# Patient Record
Sex: Female | Born: 1955 | Race: White | Hispanic: No | Marital: Married | Smoking: Never smoker
Health system: Southern US, Community
[De-identification: ages and names within clinical notes are randomized; demographics above are authoritative.]

## PROBLEM LIST (undated history)

## (undated) DIAGNOSIS — J45909 Unspecified asthma, uncomplicated: Secondary | ICD-10-CM

## (undated) DIAGNOSIS — L309 Dermatitis, unspecified: Secondary | ICD-10-CM

## (undated) DIAGNOSIS — J069 Acute upper respiratory infection, unspecified: Secondary | ICD-10-CM

## (undated) HISTORY — DX: Dermatitis, unspecified: L30.9

## (undated) HISTORY — PX: BREAST EXCISIONAL BIOPSY: SUR124

## (undated) HISTORY — PX: SINOSCOPY: SHX187

## (undated) HISTORY — DX: Acute upper respiratory infection, unspecified: J06.9

## (undated) HISTORY — DX: Unspecified asthma, uncomplicated: J45.909

---

## 2007-08-28 ENCOUNTER — Emergency Department (HOSPITAL_COMMUNITY): Admission: EM | Admit: 2007-08-28 | Discharge: 2007-08-28 | Payer: Self-pay | Admitting: Emergency Medicine

## 2007-09-08 ENCOUNTER — Encounter: Admission: RE | Admit: 2007-09-08 | Discharge: 2007-09-08 | Payer: Self-pay | Admitting: Family Medicine

## 2007-10-15 ENCOUNTER — Ambulatory Visit: Payer: Self-pay | Admitting: Gastroenterology

## 2007-10-15 LAB — CONVERTED CEMR LAB
BUN: 11 mg/dL (ref 6–23)
Basophils Relative: 1.3 % — ABNORMAL HIGH (ref 0.0–1.0)
Bilirubin, Direct: 0.1 mg/dL (ref 0.0–0.3)
CO2: 28 meq/L (ref 19–32)
Calcium: 9.4 mg/dL (ref 8.4–10.5)
GFR calc Af Amer: 137 mL/min
Hemoglobin: 14.8 g/dL (ref 12.0–15.0)
MCHC: 34.8 g/dL (ref 30.0–36.0)
Monocytes Absolute: 0.5 10*3/uL (ref 0.2–0.7)
Monocytes Relative: 8.5 % (ref 3.0–11.0)
Neutro Abs: 2.9 10*3/uL (ref 1.4–7.7)
Potassium: 4.6 meq/L (ref 3.5–5.1)
RBC: 4.74 M/uL (ref 3.87–5.11)
Sodium: 141 meq/L (ref 135–145)
Total Bilirubin: 0.6 mg/dL (ref 0.3–1.2)

## 2007-10-16 ENCOUNTER — Encounter: Payer: Self-pay | Admitting: Gastroenterology

## 2007-10-20 ENCOUNTER — Ambulatory Visit: Payer: Self-pay | Admitting: Gastroenterology

## 2007-10-20 ENCOUNTER — Encounter: Payer: Self-pay | Admitting: Gastroenterology

## 2007-10-20 DIAGNOSIS — K219 Gastro-esophageal reflux disease without esophagitis: Secondary | ICD-10-CM

## 2007-10-20 LAB — CONVERTED CEMR LAB
Alkaline Phosphatase: 65 units/L (ref 39–117)
Bilirubin, Direct: 0.1 mg/dL (ref 0.0–0.3)
Iron: 89 ug/dL (ref 42–145)
Total Bilirubin: 0.9 mg/dL (ref 0.3–1.2)
Total Protein: 7.6 g/dL (ref 6.0–8.3)

## 2008-01-20 DIAGNOSIS — F329 Major depressive disorder, single episode, unspecified: Secondary | ICD-10-CM

## 2008-01-20 DIAGNOSIS — I10 Essential (primary) hypertension: Secondary | ICD-10-CM | POA: Insufficient documentation

## 2008-01-20 DIAGNOSIS — R197 Diarrhea, unspecified: Secondary | ICD-10-CM

## 2008-01-20 DIAGNOSIS — J45909 Unspecified asthma, uncomplicated: Secondary | ICD-10-CM | POA: Insufficient documentation

## 2008-01-20 DIAGNOSIS — K449 Diaphragmatic hernia without obstruction or gangrene: Secondary | ICD-10-CM | POA: Insufficient documentation

## 2008-01-20 DIAGNOSIS — E7800 Pure hypercholesterolemia, unspecified: Secondary | ICD-10-CM

## 2008-10-06 ENCOUNTER — Other Ambulatory Visit: Admission: RE | Admit: 2008-10-06 | Discharge: 2008-10-06 | Payer: Self-pay | Admitting: Family Medicine

## 2008-11-18 ENCOUNTER — Encounter: Admission: RE | Admit: 2008-11-18 | Discharge: 2008-11-18 | Payer: Self-pay | Admitting: Family Medicine

## 2011-04-17 NOTE — Assessment & Plan Note (Signed)
[facility]                         GASTROENTEROLOGY OFFICE NOTE   NAME:[name], [name] DEVLIN                      MRN:          [id number]  DATE:10/15/2007                            DOB:          Dec 15, 1955    REASON FOR REFERRAL:  Dr. Smith Mince asked me to evaluate [name] [name] in  consultation regarding chronic cough, burning in throat, diarrhea,  recent abdominal pains.   HISTORY OF PRESENT ILLNESS:  [name] [name] is a very pleasant 55 year old  woman who has a variety of upper and lower GI symptoms.  First, she  presented to the emergency room with mid epigastric abdominal pains that  were quite severe.  This was about 4-5 weeks ago.  She does not have  associated diarrhea or vomiting.  She had a lab test and imaging studies  done in the emergency room.  Lab tests showed a white count of 14.6,  transaminases were elevated with an AST of 112 and ALT of 235, normal  bilirubin.  She had a CT scan performed showing some very small  hypodensities in her liver that were thought nonspecific, they described  GERD, they also described some borderline wall thickening in her  ascending colon very mild colitis.  She was told that she had an  infection in her colon, she was sent home with 2 antibiotic.  She  believe it was ciprofloxacin and Flagyl.  Since then, the abdominal pain  has subsided.  She completed her antibiotic course and she has noticed a  dramatic increase in her daily stooling.  For years, she would go 5  times a day, intermittently seeing blood but not any dramatic blood.  Since the antibiotics she has been going 10-15 times a day, it has been  looser than usual.  She has no nocturnal diarrhea.   She also describes chronic belching, chronic burning in her throat.  She  does have intermittent mild solid food dysphagia.  She has never been on  antacid medicines, as far as she knows.   REVIEW OF SYSTEMS:  Notable for 20 pounds weight gain in the past  6  months, is otherwise essentially normal and is available on her nursing  intake sheet.   PAST MEDICAL HISTORY:  1. Hypertension.  2. Asthma.  3. Elevated cholesterol.  4. Depression.   CURRENT MEDICINES:  Zyrtec.   ALLERGIES:  NO KNOWN DRUG ALLERGIES.   SOCIAL HISTORY:  Single, lives with her son and his father.  Works as a  Runner, broadcasting/film/video.  A nonsmoker, drinks alcohol daily.  Chews gum all the time,  eats a lot of peppermints on a daily basis, rare caffeine.  Eats her  dinner generally at 8 o'clock, is done around 9 and lays down for bed at  about 10.  She says she lays down for naps frequently following meals.   FAMILY HISTORY:  No colon cancer, colon polyps in family.   PHYSICAL EXAMINATION:  Five foot 4 inches, 171 pounds, blood pressure  138/66, pulse 94.  CONSTITUTIONAL:  Generally well-appearing.  Coughing, dry cough  frequently throughout her examination.  NEUROLOGIC:  Alert and  oriented x3.  EYES:  Extraocular movements intact.  MOUTH:  Oropharynx moist, no lesions.  NECK:  Supple, no lymphadenopathy.  CARDIOVASCULAR:  Heart regular rate and rhythm.  LUNGS:  Clear to auscultation bilaterally.  ABDOMEN:  Soft, nontender, nondistended, normal bowel sounds.  EXTREMITIES:  No lower extremity edema.  SKIN:  No rashes or lesions on visible extremities.   ASSESSMENT/PLAN:  A 55 year old woman with diarrhea (worse since recent  antibiotics for colitis), acid taste in her throat, chronic belching,  intermittent dysphagia.   Suspect [name] [name] has underlying gastroesophageal reflux disease.  She  eats meals very soon before laying down for bed or naps and that is  probably contributing.  She also eats a lot of peppermint and gum and  that could be contributing as well.  I am giving her a gastroesophageal  reflux disease handout, I am recommending strict avoidance of all mint  products.  I have instructed her not to lay down for bed within 3 hours  of finishing a meal.  I  think we should proceed with a full upper  endoscopy at her soonest convenience.  She did have abnormal liver tests  in the emergency room.  This was not really mentioned in the emergency  room visits.  I will repeat lab testing including a CBC and a complete  metabolic profile as well as stool testing for her worsening of her  chronic loose stools.  She does have intermittent bleeding rectally and  with going 15 times a day now I think we should also proceed with a  colonoscopy at her soonest convenience as well.     Rachael Fee, MD  Electronically Signed    DPJ/MedQ  DD: 10/15/2007  DT: 10/15/2007  Job #: [id number]   cc:   Talmadge Coventry, M.D.

## 2011-09-13 LAB — LIPASE, BLOOD: Lipase: 30

## 2011-09-13 LAB — COMPREHENSIVE METABOLIC PANEL
ALT: 235 — ABNORMAL HIGH
CO2: 25
Chloride: 98
Potassium: 3.6
Sodium: 135
Total Bilirubin: 0.8

## 2011-09-13 LAB — DIFFERENTIAL
Basophils Relative: 0
Eosinophils Relative: 0
Lymphs Abs: 1.1
Monocytes Absolute: 0.3

## 2011-09-13 LAB — CBC
HCT: 50.4 — ABNORMAL HIGH
Hemoglobin: 17.1 — ABNORMAL HIGH
MCV: 86.9
Platelets: 345
RDW: 14.4 — ABNORMAL HIGH
WBC: 14.6 — ABNORMAL HIGH

## 2011-09-13 LAB — URINALYSIS, ROUTINE W REFLEX MICROSCOPIC
Bilirubin Urine: NEGATIVE
Nitrite: NEGATIVE
Protein, ur: >300 — AB
Specific Gravity, Urine: 1.025

## 2011-09-13 LAB — URINE MICROSCOPIC-ADD ON

## 2013-02-25 ENCOUNTER — Other Ambulatory Visit: Payer: Self-pay | Admitting: Family Medicine

## 2013-02-25 ENCOUNTER — Other Ambulatory Visit (HOSPITAL_COMMUNITY)
Admission: RE | Admit: 2013-02-25 | Discharge: 2013-02-25 | Disposition: A | Discharge status: Home / Self Care | Payer: BC Managed Care – PPO | Source: Ambulatory Visit | Attending: Family Medicine | Admitting: Family Medicine

## 2013-02-25 DIAGNOSIS — Z01419 Encounter for gynecological examination (general) (routine) without abnormal findings: Secondary | ICD-10-CM | POA: Insufficient documentation

## 2013-02-25 DIAGNOSIS — Z1151 Encounter for screening for human papillomavirus (HPV): Secondary | ICD-10-CM | POA: Insufficient documentation

## 2017-05-27 ENCOUNTER — Other Ambulatory Visit: Payer: Self-pay | Admitting: Family Medicine

## 2017-05-27 DIAGNOSIS — Z1231 Encounter for screening mammogram for malignant neoplasm of breast: Secondary | ICD-10-CM

## 2017-06-04 ENCOUNTER — Ambulatory Visit
Admission: RE | Admit: 2017-06-04 | Discharge: 2017-06-04 | Disposition: A | Discharge status: Home / Self Care | Payer: BC Managed Care – PPO | Source: Ambulatory Visit | Attending: Family Medicine | Admitting: Family Medicine

## 2017-06-04 DIAGNOSIS — Z1231 Encounter for screening mammogram for malignant neoplasm of breast: Secondary | ICD-10-CM

## 2017-07-23 ENCOUNTER — Other Ambulatory Visit (HOSPITAL_BASED_OUTPATIENT_CLINIC_OR_DEPARTMENT_OTHER): Payer: Self-pay

## 2017-07-23 DIAGNOSIS — G478 Other sleep disorders: Secondary | ICD-10-CM

## 2017-07-23 DIAGNOSIS — G4710 Hypersomnia, unspecified: Secondary | ICD-10-CM

## 2017-07-23 DIAGNOSIS — R441 Visual hallucinations: Secondary | ICD-10-CM

## 2017-07-23 DIAGNOSIS — R0683 Snoring: Secondary | ICD-10-CM

## 2017-07-23 DIAGNOSIS — G4759 Other parasomnia: Secondary | ICD-10-CM

## 2017-07-24 ENCOUNTER — Ambulatory Visit (HOSPITAL_BASED_OUTPATIENT_CLINIC_OR_DEPARTMENT_OTHER): Payer: BC Managed Care – PPO | Attending: Family Medicine | Admitting: Internal Medicine

## 2017-07-24 VITALS — Ht 64.0 in | Wt 162.0 lb

## 2017-07-24 DIAGNOSIS — R442 Other hallucinations: Secondary | ICD-10-CM | POA: Diagnosis not present

## 2017-07-24 DIAGNOSIS — R0683 Snoring: Secondary | ICD-10-CM | POA: Diagnosis present

## 2017-07-24 DIAGNOSIS — G4710 Hypersomnia, unspecified: Secondary | ICD-10-CM

## 2017-07-24 DIAGNOSIS — K219 Gastro-esophageal reflux disease without esophagitis: Secondary | ICD-10-CM | POA: Diagnosis not present

## 2017-07-24 DIAGNOSIS — G4753 Recurrent isolated sleep paralysis: Secondary | ICD-10-CM | POA: Diagnosis not present

## 2017-07-24 DIAGNOSIS — G478 Other sleep disorders: Secondary | ICD-10-CM

## 2017-07-24 DIAGNOSIS — G4736 Sleep related hypoventilation in conditions classified elsewhere: Secondary | ICD-10-CM | POA: Diagnosis not present

## 2017-07-24 DIAGNOSIS — G4733 Obstructive sleep apnea (adult) (pediatric): Secondary | ICD-10-CM | POA: Insufficient documentation

## 2017-07-24 DIAGNOSIS — R441 Visual hallucinations: Secondary | ICD-10-CM

## 2017-07-24 DIAGNOSIS — G4759 Other parasomnia: Secondary | ICD-10-CM

## 2017-07-24 DIAGNOSIS — I10 Essential (primary) hypertension: Secondary | ICD-10-CM | POA: Diagnosis not present

## 2017-07-24 DIAGNOSIS — G4719 Other hypersomnia: Secondary | ICD-10-CM | POA: Insufficient documentation

## 2017-07-28 NOTE — Procedures (Signed)
   NAME: [name] [name] DATE OF BIRTH:  [birth year] MEDICAL RECORD NUMBER [id number]  LOCATION: [facility]  PHYSICIAN: Benjaman Kindler, MD  DATE OF STUDY: 07/24/2017  SLEEP STUDY TYPE: Nocturnal Polysomnogram               REFERRING PROVIDER: Carilyn Goodpasture, PA-C  INDICATION FOR STUDY: nonrestorative sleep, snoring, excessive daytime sleepiness, sleep paralysis, sleep hallucinations; symptoms x years. Comorbidities: HTN. GERD   EPWORTH SLEEPINESS SCORE: 14 HEIGHT: 5\' 4"  (162.6 cm)  WEIGHT: 162 lb (73.5 kg)    Body mass index is 27.81 kg/m.  NECK SIZE: 14 in.  MEDICATIONS Patient self administered medications include: N/A. Medications administered during study include No sleep medicine administered.Marland Kitchen  SLEEP STUDY TECHNIQUE A multi-channel overnight Polysomnography study was performed. The channels recorded and monitored were central and occipital EEG, electrooculogram (EOG), submentalis EMG (chin), nasal and oral airflow, thoracic and abdominal wall motion, anterior tibialis EMG, snore microphone, electrocardiogram, and a pulse oximetry.  TECHNICAL COMMENTS Comments added by Technician: PATIENT WAS ORDERED AS A NPSG ONLY.  Comments added by Scorer: N/A  SLEEP ARCHITECTURE The study was initiated at 10:15:13 PM and terminated at 4:43:26 AM. The total recorded time was 388.2 minutes. EEG confirmed total sleep time was 339.8 minutes yielding a sleep efficiency of 87.5%. Sleep onset after lights out was 30.9 minutes with a REM latency of 254.5 minutes. The patient spent 15.89% of the night in stage N1 sleep, 72.19% in stage N2 sleep, 0.00% in stage N3 and 11.92% in REM. Wake after sleep onset (WASO) was 17.5 minutes. The Arousal Index was 30.7/hour.  RESPIRATORY PARAMETERS There were a total of 116 respiratory disturbances out of which 86 were apneas ( 82 obstructive, 4 mixed, 0 central) and 30 hypopneas. The apnea/hypopnea index (AHI) was 20.5 events/hour. The  central sleep apnea index was 0.0 events/hour. The REM AHI was 13.3 events/hour and NREM AHI was 21.4 events/hour. The supine AHI was 24.7 events/hour and the non supine AHI was 16.25 supine during 50.02% of sleep. Overall RDI was 35 events/hour and REM RDI was 25 events/hour. Respiratory disturbances were associated with oxygen desaturation down to a nadir of 89.00% during sleep. The mean oxygen saturation during the study was 93.89%. The cumulative time under 88% oxygen saturation was 0.0 minutes.  LEG MOVEMENT DATA The total leg movements were with a resulting leg movement index of 1.2/hr . Associated arousal with leg movement index was 0.2/hr.  CARDIAC DATA The underlying cardiac rhythm was most consistent with sinus rhythm. Mean heart rate during sleep was 74.02 bpm. Additional rhythm abnormalities include None.  IMPRESSIONS Moderate obstructive sleep apnea Minimal hypoxemia associated with sleep disordered breathing Sleep disordered breathing is worse in supine sleep  DIAGNOSIS - Obstructive Sleep Apnea (327.23 [G47.33 ICD-10])  RECOMMENDATIONS The patient should be treated with CPAP or an oral appliance. If CPAP is chosen, this could be initiated with auto-adjusting CPAP (APAP) or with in-lab CPAP titration  Benjaman Kindler, MD Sleep Specialist Certification, American Board of Internal Medicine  ELECTRONICALLY SIGNED ON:  07/28/2017, 9:14 PM [facility] PH: [phone]   FX: [phone] ACCREDITED BY THE AMERICAN ACADEMY OF SLEEP MEDICINE

## 2017-10-21 ENCOUNTER — Encounter: Payer: Self-pay | Admitting: Gastroenterology

## 2018-10-15 ENCOUNTER — Other Ambulatory Visit (HOSPITAL_COMMUNITY)
Admission: RE | Admit: 2018-10-15 | Discharge: 2018-10-15 | Disposition: A | Discharge status: Home / Self Care | Payer: BC Managed Care – PPO | Source: Ambulatory Visit | Attending: Family Medicine | Admitting: Family Medicine

## 2018-10-15 ENCOUNTER — Other Ambulatory Visit: Payer: Self-pay | Admitting: Family Medicine

## 2018-10-15 DIAGNOSIS — Z124 Encounter for screening for malignant neoplasm of cervix: Secondary | ICD-10-CM | POA: Diagnosis present

## 2018-10-16 LAB — CYTOLOGY - PAP
Diagnosis: NEGATIVE
HPV: NOT DETECTED

## 2019-01-30 ENCOUNTER — Ambulatory Visit: Payer: BC Managed Care – PPO | Admitting: Psychiatry

## 2019-01-30 ENCOUNTER — Ambulatory Visit (INDEPENDENT_AMBULATORY_CARE_PROVIDER_SITE_OTHER): Payer: BC Managed Care – PPO | Admitting: Psychiatry

## 2019-01-30 DIAGNOSIS — F331 Major depressive disorder, recurrent, moderate: Secondary | ICD-10-CM

## 2019-01-30 MED ORDER — LITHIUM CARBONATE 300 MG PO TABS: ORAL_TABLET | ORAL | 0 refills | Status: AC
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
## 2019-02-23 ENCOUNTER — Ambulatory Visit: Payer: BC Managed Care – PPO | Admitting: Psychiatry

## 2020-01-30 ENCOUNTER — Ambulatory Visit: Payer: BC Managed Care – PPO | Attending: Internal Medicine

## 2020-01-30 DIAGNOSIS — Z23 Encounter for immunization: Secondary | ICD-10-CM

## 2020-01-30 NOTE — Progress Notes (Signed)
   Covid-19 Vaccination Clinic  Name:  Lincoln Kleiner    MRN: [id number] DOB: January 06, 1956  01/30/2020  Ms. Seki was observed post Covid-19 immunization for 15 minutes without incidence. She was provided with Vaccine Information Sheet and instruction to access the V-Safe system.   Ms. Jakel was instructed to call 911 with any severe reactions post vaccine: Marland Kitchen Difficulty breathing  . Swelling of your face and throat  . A fast heartbeat  . A bad rash all over your body  . Dizziness and weakness    Immunizations Administered    Name Date Dose VIS Date Route   Pfizer COVID-19 Vaccine 01/30/2020 12:27 PM 0.3 mL 11/13/2019 Intramuscular   Manufacturer: [employer], [employer]   Lot: [id number]   NDC: [address]-1

## 2020-02-24 ENCOUNTER — Ambulatory Visit: Payer: BC Managed Care – PPO | Attending: Internal Medicine

## 2020-02-24 DIAGNOSIS — Z23 Encounter for immunization: Secondary | ICD-10-CM

## 2020-02-24 NOTE — Progress Notes (Signed)
   Covid-19 Vaccination Clinic  Name:  [name] [name]    MRN: [id number] DOB: 01/29/1956  02/24/2020  [name] was observed post Covid-19 immunization for 15 minutes without incident. She was provided with Vaccine Information Sheet and instruction to access the V-Safe system.   [name] was instructed to call 911 with any severe reactions post vaccine: Marland Kitchen Difficulty breathing  . Swelling of face and throat  . A fast heartbeat  . A bad rash all over body  . Dizziness and weakness   Immunizations Administered    Name Date Dose VIS Date Route   Pfizer COVID-19 Vaccine 02/24/2020 12:32 PM 0.3 mL 11/13/2019 Intramuscular   Manufacturer: [employer], [employer]   Lot: [id number]   NDC: [address]-2

## 2020-06-09 ENCOUNTER — Other Ambulatory Visit: Payer: Self-pay | Admitting: Family Medicine

## 2020-06-09 DIAGNOSIS — M8580 Other specified disorders of bone density and structure, unspecified site: Secondary | ICD-10-CM

## 2020-09-16 ENCOUNTER — Other Ambulatory Visit: Payer: Self-pay | Admitting: Family Medicine

## 2020-09-16 DIAGNOSIS — Z1231 Encounter for screening mammogram for malignant neoplasm of breast: Secondary | ICD-10-CM

## 2020-09-28 ENCOUNTER — Ambulatory Visit
Admission: RE | Admit: 2020-09-28 | Discharge: 2020-09-28 | Disposition: A | Discharge status: Home / Self Care | Payer: BC Managed Care – PPO | Source: Ambulatory Visit | Attending: Family Medicine | Admitting: Family Medicine

## 2020-09-28 ENCOUNTER — Other Ambulatory Visit: Payer: Self-pay

## 2020-09-28 DIAGNOSIS — Z1231 Encounter for screening mammogram for malignant neoplasm of breast: Secondary | ICD-10-CM

## 2020-12-12 ENCOUNTER — Other Ambulatory Visit: Payer: Self-pay

## 2020-12-12 ENCOUNTER — Ambulatory Visit (INDEPENDENT_AMBULATORY_CARE_PROVIDER_SITE_OTHER): Payer: BC Managed Care – PPO | Admitting: Allergy

## 2020-12-12 ENCOUNTER — Encounter: Payer: Self-pay | Admitting: Allergy

## 2020-12-12 VITALS — BP 118/72 | HR 70 | Temp 97.0°F | Resp 18 | Ht 63.0 in | Wt 168.0 lb

## 2020-12-12 DIAGNOSIS — J3089 Other allergic rhinitis: Secondary | ICD-10-CM

## 2020-12-12 DIAGNOSIS — R12 Heartburn: Secondary | ICD-10-CM | POA: Diagnosis not present

## 2020-12-12 DIAGNOSIS — L301 Dyshidrosis [pompholyx]: Secondary | ICD-10-CM

## 2020-12-12 DIAGNOSIS — J45909 Unspecified asthma, uncomplicated: Secondary | ICD-10-CM

## 2020-12-12 MED ORDER — AZELASTINE HCL 0.15 % NA SOLN: 1.0000 | Freq: Two times a day (BID) | NASAL | 5 refills | Status: DC | PRN

## 2020-12-12 MED ORDER — BUDESONIDE-FORMOTEROL FUMARATE 80-4.5 MCG/ACT IN AERO: 2.0000 | INHALATION_SPRAY | Freq: Two times a day (BID) | RESPIRATORY_TRACT | 5 refills | Status: DC

## 2020-12-12 NOTE — Assessment & Plan Note (Signed)
Diagnosed with asthma in her 30s.  Currently on Qvar unknown dose 1 puff twice a day for 10+ years, Singulair and albuterol as needed.  Patient has daily symptoms however does not like to use albuterol during these episodes.  History of reflux and taking Nexium daily.  Today's spirometry was normal with no improvement in FEV1 post bronchodilator treatment.  Clinically feeling slightly improved.  ACT score 13.  . Daily controller medication(s): start Symbicort 2 puffs twice a day with spacer and rinse mouth afterwards. o Spacer given and demonstrated proper use with inhaler. Patient understood technique and all questions/concerned were addressed.  o This replaces Qvar for now.  . May use albuterol rescue inhaler 2 puffs every 4 to 6 hours as needed for shortness of breath, chest tightness, coughing, and wheezing. May use albuterol rescue inhaler 2 puffs 5 to 15 minutes prior to strenuous physical activities. Monitor frequency of use.  . Repeat spirometry at next visit.  . If no improvement, will get CXR as well.

## 2020-12-12 NOTE — Patient Instructions (Addendum)
Today's skin testing showed:  Positive to grass, weed, ragweed, trees, dust mites, cat, dogs, mold, feathers, cockroaches.  Borderline to horse.   Asthma: . Daily controller medication(s): start Symbicort 2 puffs twice a day with spacer and rinse mouth afterwards. o Spacer given and demonstrated proper use with inhaler. Patient understood technique and all questions/concerned were addressed.  o This replaces Qvar for now.  . May use albuterol rescue inhaler 2 puffs every 4 to 6 hours as needed for shortness of breath, chest tightness, coughing, and wheezing. May use albuterol rescue inhaler 2 puffs 5 to 15 minutes prior to strenuous physical activities. Monitor frequency of use.  . Asthma control goals:  o Full participation in all desired activities (may need albuterol before activity) o Albuterol use two times or less a week on average (not counting use with activity) o Cough interfering with sleep two times or less a month o Oral steroids no more than once a year o No hospitalizations  Environmental allergies:   Start environmental control measures.  Continue Singulair (montelukast) 10mg  daily at night.  May use over the counter antihistamines such as Zyrtec (cetirizine), Claritin (loratadine), Allegra (fexofenadine), or Xyzal (levocetirizine) daily as needed.  May use Flonase (fluticasone) nasal spray 1 spray per nostril twice a day as needed for nasal congestion.   May use azelastine nasal spray 1-2 sprays per nostril twice a day as needed for runny nose/drainage.  Nasal saline spray (i.e., Simply Saline) or nasal saline lavage (i.e., NeilMed) is recommended as needed and prior to medicated nasal sprays.  Eczema:  May use Eucrisa (crisaborole) 2% ointment twice a day on mild eczema flares on the hands. This is a non-steroid ointment.   If it works well, let me know and I will send in a prescription.  If it burns, place the medication in the refrigerator.   Apply a  thin layer of moisturizer and then apply the Eucrisa on top of it.  Follow up in 2 months or sooner if needed for asthma check.   Skin care recommendations  Bath time: . Always use lukewarm water. AVOID very hot or cold water. Keep bathing time to 5-10 minutes. . Do NOT use bubble bath. . Use a mild soap and use just enough to wash the dirty areas. . Do NOT scrub skin vigorously.  . After bathing, pat dry your skin with a towel. Do NOT rub or scrub the skin.  Moisturizers and prescriptions:  . ALWAYS apply moisturizers immediately after bathing (within 3 minutes). This helps to lock-in moisture. . Use the moisturizer several times a day over the whole body. Marland Kitchen summer moisturizers include: Aveeno, CeraVe, Cetaphil. Peri Jefferson winter moisturizers include: Aquaphor, Vaseline, Cerave, Cetaphil, Eucerin, Vanicream. . When using moisturizers along with medications, the moisturizer should be applied about one hour after applying the medication to prevent diluting effect of the medication or moisturize around where you applied the medications. When not using medications, the moisturizer can be continued twice daily as maintenance.  Laundry and clothing: . Avoid laundry products with added color or perfumes. . Use unscented hypo-allergenic laundry products such as Tide free, Cheer free & gentle, and All free and clear.  . If the skin still seems dry or sensitive, you can try double-rinsing the clothes. . Avoid tight or scratchy clothing such as wool. . Do not use fabric softeners or dyer sheets.  Reducing Pollen Exposure . Pollen seasons: trees (spring), grass (summer) and ragweed/weeds (fall). 04-11-1979 Keep windows  closed in your home and car to lower pollen exposure.  Lilian Kapur air conditioning in the bedroom and throughout the house if possible.  . Avoid going out in dry windy days - especially early morning. . Pollen counts are highest between 5 - 10 AM and on dry, hot and windy days.  . Save  outside activities for late afternoon or after a heavy rain, when pollen levels are lower.  . Avoid mowing of grass if you have grass pollen allergy. Marland Kitchen Be aware that pollen can also be transported indoors on people and pets.  . Dry your clothes in an automatic dryer rather than hanging them outside where they might collect pollen.  . Rinse hair and eyes before bedtime. Control of House Dust Mite Allergen . Dust mite allergens are a common trigger of allergy and asthma symptoms. While they can be found throughout the house, these microscopic creatures thrive in warm, humid environments such as bedding, upholstered furniture and carpeting. . Because so much time is spent in the bedroom, it is essential to reduce mite levels there.  . Encase pillows, mattresses, and box springs in special allergen-proof fabric covers or airtight, zippered plastic covers.  . Bedding should be washed weekly in hot water (130 F) and dried in a hot dryer. Allergen-proof covers are available for comforters and pillows that can't be regularly washed.  Reyes Ivan the allergy-proof covers every few months. Minimize clutter in the bedroom. Keep pets out of the bedroom.  Marland Kitchen Keep humidity less than 50% by using a dehumidifier or air conditioning. You can buy a humidity measuring device called a hygrometer to monitor this.  . If possible, replace carpets with hardwood, linoleum, or washable area rugs. If that's not possible, vacuum frequently with a vacuum that has a HEPA filter. . Remove all upholstered furniture and non-washable window drapes from the bedroom. . Remove all non-washable stuffed toys from the bedroom.  Wash stuffed toys weekly. Pet Allergen Avoidance: . Contrary to popular opinion, there are no "hypoallergenic" breeds of dogs or cats. That is because people are not allergic to an animal's hair, but to an allergen found in the animal's saliva, dander (dead skin flakes) or urine. Pet allergy symptoms typically occur  within minutes. For some people, symptoms can build up and become most severe 8 to 12 hours after contact with the animal. People with severe allergies can experience reactions in public places if dander has been transported on the pet owners' clothing. Marland Kitchen Keeping an animal outdoors is only a partial solution, since homes with pets in the yard still have higher concentrations of animal allergens. . Before getting a pet, ask your allergist to determine if you are allergic to animals. If your pet is already considered part of your family, try to minimize contact and keep the pet out of the bedroom and other rooms where you spend a great deal of time. . As with dust mites, vacuum carpets often or replace carpet with a hardwood floor, tile or linoleum. . High-efficiency particulate air (HEPA) cleaners can reduce allergen levels over time. . While dander and saliva are the source of cat and dog allergens, urine is the source of allergens from rabbits, hamsters, mice and [place] pigs; so ask a non-allergic family member to clean the animal's cage. . If you have a pet allergy, talk to your allergist about the potential for allergy immunotherapy (allergy shots). This strategy can often provide long-term relief. Mold Control . Mold and fungi can grow  on a variety of surfaces provided certain temperature and moisture conditions exist.  . Outdoor molds grow on plants, decaying vegetation and soil. The major outdoor mold, Alternaria and Cladosporium, are found in very high numbers during hot and dry conditions. Generally, a late summer - fall peak is seen for common outdoor fungal spores. Rain will temporarily lower outdoor mold spore count, but counts rise rapidly when the rainy period ends. . The most important indoor molds are Aspergillus and Penicillium. Dark, humid and poorly ventilated basements are ideal sites for mold growth. The next most common sites of mold growth are the bathroom and the kitchen. Outdoor  (Seasonal) Mold Control . Use air conditioning and keep windows closed. . Avoid exposure to decaying vegetation. Marland Kitchen Avoid leaf raking. . Avoid grain handling. . Consider wearing a face mask if working in moldy areas.  Indoor (Perennial) Mold Control  . Maintain humidity below 50%. . Get rid of mold growth on hard surfaces with water, detergent and, if necessary, 5% bleach (do not mix with other cleaners). Then dry the area completely. If mold covers an area more than 10 square feet, consider hiring an indoor environmental professional. . For clothing, washing with soap and water is best. If moldy items cannot be cleaned and dried, throw them away. . Remove sources e.g. contaminated carpets. . Repair and seal leaking roofs or pipes. Using dehumidifiers in damp basements may be helpful, but empty the water and clean units regularly to prevent mildew from forming. All rooms, especially basements, bathrooms and kitchens, require ventilation and cleaning to deter mold and mildew growth. Avoid carpeting on concrete or damp floors, and storing items in damp areas. Cockroach Allergen Avoidance Cockroaches are often found in the homes of densely populated urban areas, schools or commercial buildings, but these creatures can lurk almost anywhere. This does not mean that you have a dirty house or living area. . Block all areas where roaches can enter the home. This includes crevices, wall cracks and windows.  . Cockroaches need water to survive, so fix and seal all leaky faucets and pipes. Have an exterminator go through the house when your family and pets are gone to eliminate any remaining roaches. Marland Kitchen Keep food in lidded containers and put pet food dishes away after your pets are done eating. Vacuum and sweep the floor after meals, and take out garbage and recyclables. Use lidded garbage containers in the kitchen. Wash dishes immediately after use and clean under stoves, refrigerators or toasters where crumbs  can accumulate. Wipe off the stove and other kitchen surfaces and cupboards regularly.

## 2020-12-12 NOTE — Assessment & Plan Note (Addendum)
On the hands and using clobetasol as needed.  Gave handout on proper skin care.   May use Eucrisa (crisaborole) 2% ointment twice a day on mild eczema flares on the hands. This is a non-steroid ointment. Samples given.   If it works well, let me know and I will send in a prescription.  If it burns, place the medication in the refrigerator.   Apply a thin layer of moisturizer and then apply the Eucrisa on top of it.

## 2020-12-12 NOTE — Progress Notes (Signed)
New Patient Note  RE: [name] [name] MRN: [id number] DOB: 1956-01-23 Date of Office Visit: 12/12/2020  Referring provider: No ref. provider found Primary care provider: Sigmund Hazel, MD  Chief Complaint: Allergic Reaction (No reactions to foods ) and Allergy Testing (Dust, dust mites. Trees tested positive to in the past )  History of Present Illness: I had the pleasure of seeing [name] [name] for initial evaluation at the [facility] on 12/12/2020. She is a 65 y.o. female, who is self-referred here for the evaluation of asthma and allergies.  Asthma: She reports symptoms of chest tightness, shortness of breath, coughing with post tussive emesis at times, wheezing, nocturnal awakenings for 30+ years. Current medications include Qvar unknown dose 1 puff BID x 10+ years, Singulair and albuterol prn which help. She reports not using aerochamber with inhalers. She tried the following inhalers: Flovent, Advair, Pulmicort. Main triggers are weather changes. In the last month, frequency of symptoms: daily. Frequency of SABA use: <1x/week. In the last 12 months, emergency room visits/urgent care visits/doctor office visits or hospitalizations due to respiratory issues: 1. In the last 12 months, oral steroids courses: one. Lifetime history of hospitalization for respiratory issues: none. Prior intubations: no. Asthma was diagnosed at age 52s. History of pneumonia: twice. She was evaluated by allergist in the past. Smoking exposure: no. Up to date with flu vaccine: no. Up to date with COVID-19 vaccine: yes.  History of reflux: takes Nexium daily with good benefit.  Rhinitis:  She reports symptoms of nasal congestion, sneezing, coughing, itchy eyes. Symptoms have been going on for 40+ years. The symptoms are present all year around with worsening in spring and fall. Anosmia: sometimes diminished. Headache: yes. She has used Flonase prn, Singulair, zyrtec with some improvement in  symptoms. Sinus infections: 3-4 per year. Previous work up includes: skin testing in the past (over 30 years ago) was positive to trees, dust mites per patient report.  Previous ENT evaluation: not recently Previous sinus imaging: no. History of nasal polyps: no.  Assessment and Plan: [name] is a 65 y.o. female with: Asthma, not well controlled Diagnosed with asthma in her 30s.  Currently on Qvar unknown dose 1 puff twice a day for 10+ years, Singulair and albuterol as needed.  Patient has daily symptoms however does not like to use albuterol during these episodes.  History of reflux and taking Nexium daily.  Today's spirometry was normal with no improvement in FEV1 post bronchodilator treatment.  Clinically feeling slightly improved.  ACT score 13.  . Daily controller medication(s): start Symbicort 2 puffs twice a day with spacer and rinse mouth afterwards. o Spacer given and demonstrated proper use with inhaler. Patient understood technique and all questions/concerned were addressed.  o This replaces Qvar for now.  . May use albuterol rescue inhaler 2 puffs every 4 to 6 hours as needed for shortness of breath, chest tightness, coughing, and wheezing. May use albuterol rescue inhaler 2 puffs 5 to 15 minutes prior to strenuous physical activities. Monitor frequency of use.  . Repeat spirometry at next visit.  . If no improvement, will get CXR as well.   Other allergic rhinitis Perennial rhinoconjunctivitis symptoms with worsening in the spring and fall for the last 40+ years.  Using Flonase, Singulair and Zyrtec with some benefit.  3-4 sinus infections per year.  Today's skin testing showed: Positive to grass, weed, ragweed, trees, dust mites, cat, dogs, mold, feathers, cockroaches.  Borderline to horse.   Start  environmental control measures.  Continue Singulair (montelukast) 10mg  daily at night.  May use over the counter antihistamines such as Zyrtec (cetirizine), Claritin  (loratadine), Allegra (fexofenadine), or Xyzal (levocetirizine) daily as needed.  May use Flonase (fluticasone) nasal spray 1 spray per nostril twice a day as needed for nasal congestion.   May use azelastine nasal spray 1-2 sprays per nostril twice a day as needed for runny nose/drainage.  Nasal saline spray (i.e., Simply Saline) or nasal saline lavage (i.e., NeilMed) is recommended as needed and prior to medicated nasal sprays.  If still has frequent sinus infections this year - will get basic immune evaluation bloodwork.   Consider AIT for long term control but breathing has to be in better control first.   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  Gave handout on proper skin care.   May use Eucrisa (crisaborole) 2% ointment twice a day on mild eczema flares on the hands. This is a non-steroid ointment. Samples given.   If it works well, let me know and I will send in a prescription.  If it burns, place the medication in the refrigerator.   Apply a thin layer of moisturizer and then apply the Eucrisa on top of it.  Heartburn  Continue Nexium daily as prescribed.  Return in about 2 months (around 02/09/2021).  Meds ordered this encounter  Medications  . Azelastine HCl 0.15 % SOLN    Sig: Place 1-2 sprays into the nose 2 (two) times daily as needed (drainage).    Dispense:  30 mL    Refill:  5  . budesonide-formoterol (SYMBICORT) 80-4.5 MCG/ACT inhaler    Sig: Inhale 2 puffs into the lungs in the morning and at bedtime. with spacer and rinse mouth afterwards.    Dispense:  1 each    Refill:  5   Other allergy screening: Food allergy: no Medication allergy: no Hymenoptera allergy: localized reactions Urticaria: no Eczema:yes - clobetasol prn  Diagnostics: Spirometry:  Tracings reviewed. Her effort: Good reproducible efforts. FVC: 2.71L FEV1: 2.20L, 91% predicted FEV1/FVC ratio: 81% Interpretation: Spirometry consistent with normal pattern  with no improvement in FEV1 post bronchodilator treatment.  Clinically feeling slightly improved. Please see scanned spirometry results for details.  Skin Testing: Environmental allergy panel. Positive to grass, weed, ragweed, trees, dust mites, cat, dogs, mold, feathers, cockroaches.  Borderline to horse.  Results discussed with patient/family.  Airborne Adult Perc - 12/12/20 1044    Time Antigen Placed 1044    Allergen Manufacturer Greer    Location Back    Number of Test 59    Panel 1 Select    1. Control-Buffer 50% Glycerol Negative    2. Control-Histamine 1 mg/ml 2+    3. Albumin saline Negative    4. Bahia 2+    5. French Southern [place] 2+    6. Johnson 2+    7. Kentucky Blue 3+    8. Meadow Fescue 2+    9. Perennial Rye 3+    10. Sweet Vernal 2+    11. Timothy 3+    12. Cocklebur 2+    13. Burweed Marshelder 2+    14. Ragweed, short 4+    15. Ragweed, Giant 3+    16. Plantain,  English 2+    17. Lamb's Quarters Negative    18. Sheep Sorrell 2+    19. Rough Pigweed 2+    20. Montine CircleMarsh Elder, Rough --   -/+   21. Mugwort, Common Negative  22. Ash mix Negative    23. Birch mix Negative    24. Beech American Negative    25. Box, Elder 2+    26. Cedar, red Negative    27. Cottonwood, Guinea-BissauEastern Negative    28. Elm mix Negative    29. Hickory 2+    30. Maple mix 2+    31. Oak, Guinea-Bissau[place]astern mix 3+    32. Pecan Pollen Negative    33. Pine mix Negative    34. Sycamore Eastern Negative    35. Walnut, Black Pollen Negative   +/-   36. Alternaria alternata Negative    37. Cladosporium Herbarum Negative    38. Aspergillus mix Negative    39. Penicillium mix Negative    40. Bipolaris sorokiniana (Helminthosporium) Negative    41. Drechslera spicifera (Curvularia) Negative    42. Mucor plumbeus Negative    43. Fusarium moniliforme Negative    44. Aureobasidium pullulans (pullulara) Negative    45. Rhizopus oryzae Negative    46. Botrytis cinera 2+    47. Epicoccum nigrum 2+    48. Phoma  betae Negative    49. Candida Albicans Negative    50. Trichophyton mentagrophytes Negative    51. Mite, D Farinae  5,000 AU/ml 4+    52. Mite, D Pteronyssinus  5,000 AU/ml Negative    53. Cat Hair 10,000 BAU/ml 3+    54.  Dog Epithelia 2+    55. Mixed Feathers 2+    56. Horse Epithelia --   +/-   2457. Cockroach, German Negative    58. Mouse Negative    59. Tobacco Leaf Negative          Intradermal - 12/12/20 1123    Time Antigen Placed 1123    Allergen Manufacturer Waynette ButteryGreer    Location Arm    Number of Test 6    Control Negative    Mold 1 2+    Mold 2 2+    Mold 3 3+    Mold 4 3+    Cockroach 3+           Past Medical History: Patient Active Problem List   Diagnosis Date Noted  . Other allergic rhinitis 12/12/2020  . Dyshidrotic eczema 12/12/2020  . Heartburn 12/12/2020  . HYPERCHOLESTEROLEMIA 01/20/2008  . DEPRESSION 01/20/2008  . HYPERTENSION 01/20/2008  . Asthma, not well controlled 01/20/2008  . HIATAL HERNIA 01/20/2008  . DIARRHEA 01/20/2008  . GASTROESOPHAGEAL REFLUX DISEASE 10/20/2007   Past Medical History:  Diagnosis Date  . Asthma   . Eczema   . Recurrent upper respiratory infection (URI)    Past Surgical History: Past Surgical History:  Procedure Laterality Date  . BREAST EXCISIONAL BIOPSY  2000?   Pt states exc bx, benign, for milk duct, unsure which side, cannot see scar  . SINOSCOPY     Medication List:  Current Outpatient Medications  Medication Sig Dispense Refill  . albuterol (VENTOLIN HFA) 108 (90 Base) MCG/ACT inhaler Inhale into the lungs.    . Black Cohosh 540 MG CAPS See admin instructions.    Marland Kitchen. buPROPion (WELLBUTRIN XL) 150 MG 24 hr tablet Take 450 mg by mouth daily.    . Calcium Carbonate-Vit D-Min (CALCIUM 1200 PO)     . cetirizine (ZYRTEC) 10 MG tablet 1 tablet    . Cholecalciferol (VITAMIN D3 PO)     . clobetasol cream (TEMOVATE) 0.05 % 1 application to affected area    . fluticasone (FLONASE) 50 MCG/ACT nasal  spray Place  into both nostrils.    . montelukast (SINGULAIR) 10 MG tablet Take 10 mg by mouth daily.    . Multiple Vitamin (MULTIVITAMINS PO) 1 po    . olmesartan (BENICAR) 20 MG tablet Take 20 mg by mouth daily.    . simvastatin (ZOCOR) 20 MG tablet Take 20 mg by mouth at bedtime.    . valACYclovir (VALTREX) 1000 MG tablet 2 tablets    . VYVANSE 30 MG capsule Take 30 mg by mouth at bedtime.    Cliffton Asters Petrolatum-Mineral Oil (SYSTANE NIGHTTIME) OINT See admin instructions.    Marland Kitchen XIIDRA 5 % SOLN     . Azelastine HCl 0.15 % SOLN Place 1-2 sprays into the nose 2 (two) times daily as needed (drainage). (Patient not taking: Reported on 12/12/2020) 30 mL 5  . budesonide-formoterol (SYMBICORT) 80-4.5 MCG/ACT inhaler Inhale 2 puffs into the lungs in the morning and at bedtime. with spacer and rinse mouth afterwards. (Patient not taking: Reported on 12/12/2020) 1 each 5  . lithium 300 MG tablet 1 a day for a week, then 2/day. Get lab after on 2/day for a week (Patient not taking: Reported on 12/12/2020) 60 tablet 0   No current facility-administered medications for this visit.   Allergies: Allergies  Allergen Reactions  . Omeprazole Other (See Comments)  . Zantac [Ranitidine] Other (See Comments)   Social History: Social History   Socioeconomic History  . Marital status: Married    Spouse name: Not on file  . Number of children: Not on file  . Years of education: Not on file  . Highest education level: Not on file  Occupational History  . Not on file  Tobacco Use  . Smoking status: Never Smoker  . Smokeless tobacco: Never Used  Vaping Use  . Vaping Use: Never used  Substance and Sexual Activity  . Alcohol use: Yes  . Drug use: Never  . Sexual activity: Not on file  Other Topics Concern  . Not on file  Social History Narrative  . Not on file   Social Determinants of Health   Financial Resource Strain: Not on file  Food Insecurity: Not on file  Transportation Needs: Not on file  Physical  Activity: Not on file  Stress: Not on file  Social Connections: Not on file   Lives in a 63 year old home. Smoking: denies Occupation: Adult nurse HistorySurveyor, minerals in the house: no Engineer, civil (consulting) in the family room: no Carpet in the bedroom: no Heating: electric Cooling: central Pet: yes 1 cat x 10 years  Family History: Family History  Family history unknown: Yes   Problem                               Relation Asthma                                   Grandmother Eczema                                No  Food allergy                          No  Allergic rhino conjunctivitis     Father   Review of Systems  Constitutional:  Positive for appetite change. Negative for fever.  HENT: Negative for congestion and rhinorrhea.   Eyes: Negative for itching.  Respiratory: Positive for cough, chest tightness, shortness of breath and wheezing.   Cardiovascular: Negative for chest pain.  Gastrointestinal: Positive for constipation. Negative for abdominal pain.  Genitourinary: Negative for difficulty urinating.  Skin: Positive for rash.  Allergic/Immunologic: Positive for environmental allergies.  Neurological: Negative for headaches.   Objective: BP 118/72 (BP Location: Right Arm, Patient Position: Sitting, Cuff Size: Normal)   Pulse 70   Temp (!) 97 F (36.1 C)   Resp 18   Ht 5\' 3"  (1.6 m)   Wt 168 lb (76.2 kg)   SpO2 97%   BMI 29.76 kg/m  Body mass index is 29.76 kg/m. Physical Exam Vitals and nursing note reviewed.  Constitutional:      Appearance: Normal appearance. She is well-developed.  HENT:     Head: Normocephalic and atraumatic.     Right Ear: Tympanic membrane and external ear normal.     Left Ear: Tympanic membrane and external ear normal.     Nose: Nose normal.     Mouth/Throat:     Mouth: Mucous membranes are moist.     Pharynx: Oropharynx is clear.  Eyes:     Conjunctiva/sclera: Conjunctivae normal.  Cardiovascular:     Rate and  Rhythm: Normal rate and regular rhythm.     Heart sounds: Normal heart sounds. No murmur heard. No friction rub. No gallop.   Pulmonary:     Effort: Pulmonary effort is normal.     Breath sounds: Normal breath sounds. No wheezing, rhonchi or rales.  Musculoskeletal:     Cervical back: Neck supple.  Skin:    General: Skin is warm and dry.     Comments: Dry hands with areas of mild erythema.  Neurological:     Mental Status: She is alert and oriented to person, place, and time.  Psychiatric:        Behavior: Behavior normal.    The plan was reviewed with the patient/family, and all questions/concerned were addressed.  It was my pleasure to see Maika today and participate in her care. Please feel free to contact me with any questions or concerns.  Sincerely,  Steward Drone, DO Allergy & Immunology  [facility] office: [phone] [facility] office: [phone]

## 2020-12-12 NOTE — Assessment & Plan Note (Signed)
   Continue Nexium daily as prescribed.

## 2020-12-12 NOTE — Assessment & Plan Note (Signed)
Perennial rhinoconjunctivitis symptoms with worsening in the spring and fall for the last 40+ years.  Using Flonase, Singulair and Zyrtec with some benefit.  3-4 sinus infections per year.  Today's skin testing showed: Positive to grass, weed, ragweed, trees, dust mites, cat, dogs, mold, feathers, cockroaches.  Borderline to horse.   Start environmental control measures.  Continue Singulair (montelukast) 10mg  daily at night.  May use over the counter antihistamines such as Zyrtec (cetirizine), Claritin (loratadine), Allegra (fexofenadine), or Xyzal (levocetirizine) daily as needed.  May use Flonase (fluticasone) nasal spray 1 spray per nostril twice a day as needed for nasal congestion.   May use azelastine nasal spray 1-2 sprays per nostril twice a day as needed for runny nose/drainage.  Nasal saline spray (i.e., Simply Saline) or nasal saline lavage (i.e., NeilMed) is recommended as needed and prior to medicated nasal sprays.  If still has frequent sinus infections this year - will get basic immune evaluation bloodwork.   Consider AIT for long term control but breathing has to be in better control first.

## 2021-01-03 ENCOUNTER — Encounter (HOSPITAL_BASED_OUTPATIENT_CLINIC_OR_DEPARTMENT_OTHER): Payer: Self-pay

## 2021-01-03 ENCOUNTER — Other Ambulatory Visit: Payer: Self-pay

## 2021-01-03 ENCOUNTER — Emergency Department (HOSPITAL_BASED_OUTPATIENT_CLINIC_OR_DEPARTMENT_OTHER): Payer: BC Managed Care – PPO

## 2021-01-03 ENCOUNTER — Emergency Department (HOSPITAL_BASED_OUTPATIENT_CLINIC_OR_DEPARTMENT_OTHER)
Admission: EM | Admit: 2021-01-03 | Discharge: 2021-01-03 | Disposition: A | Discharge status: Home / Self Care | Payer: BC Managed Care – PPO | Attending: Emergency Medicine | Admitting: Emergency Medicine

## 2021-01-03 DIAGNOSIS — K5732 Diverticulitis of large intestine without perforation or abscess without bleeding: Secondary | ICD-10-CM | POA: Diagnosis not present

## 2021-01-03 DIAGNOSIS — D735 Infarction of spleen: Secondary | ICD-10-CM | POA: Diagnosis not present

## 2021-01-03 DIAGNOSIS — Z7951 Long term (current) use of inhaled steroids: Secondary | ICD-10-CM | POA: Insufficient documentation

## 2021-01-03 DIAGNOSIS — I10 Essential (primary) hypertension: Secondary | ICD-10-CM | POA: Diagnosis not present

## 2021-01-03 DIAGNOSIS — M545 Low back pain, unspecified: Secondary | ICD-10-CM | POA: Insufficient documentation

## 2021-01-03 DIAGNOSIS — R1084 Generalized abdominal pain: Secondary | ICD-10-CM | POA: Diagnosis present

## 2021-01-03 DIAGNOSIS — K219 Gastro-esophageal reflux disease without esophagitis: Secondary | ICD-10-CM | POA: Insufficient documentation

## 2021-01-03 DIAGNOSIS — Z79899 Other long term (current) drug therapy: Secondary | ICD-10-CM | POA: Diagnosis not present

## 2021-01-03 DIAGNOSIS — K5900 Constipation, unspecified: Secondary | ICD-10-CM | POA: Diagnosis not present

## 2021-01-03 DIAGNOSIS — J45909 Unspecified asthma, uncomplicated: Secondary | ICD-10-CM | POA: Insufficient documentation

## 2021-01-03 DIAGNOSIS — K5792 Diverticulitis of intestine, part unspecified, without perforation or abscess without bleeding: Secondary | ICD-10-CM

## 2021-01-03 LAB — COMPREHENSIVE METABOLIC PANEL
ALT: 39 U/L (ref 0–44)
AST: 39 U/L (ref 15–41)
Albumin: 4.1 g/dL (ref 3.5–5.0)
Alkaline Phosphatase: 51 U/L (ref 38–126)
Anion gap: 10 (ref 5–15)
BUN: 31 mg/dL — ABNORMAL HIGH (ref 8–23)
CO2: 24 mmol/L (ref 22–32)
Calcium: 9.8 mg/dL (ref 8.9–10.3)
Chloride: 103 mmol/L (ref 98–111)
Creatinine, Ser: 2.23 mg/dL — ABNORMAL HIGH (ref 0.44–1.00)
GFR, Estimated: 24 mL/min — ABNORMAL LOW (ref >60)
Glucose, Bld: 109 mg/dL — ABNORMAL HIGH (ref 70–99)
Potassium: 4.2 mmol/L (ref 3.5–5.1)
Sodium: 137 mmol/L (ref 135–145)
Total Bilirubin: 0.4 mg/dL (ref 0.3–1.2)
Total Protein: 7.6 g/dL (ref 6.5–8.1)

## 2021-01-03 LAB — CBC WITH DIFFERENTIAL/PLATELET
Abs Immature Granulocytes: 0.02 10*3/uL (ref 0.00–0.07)
Basophils Absolute: 0.1 10*3/uL (ref 0.0–0.1)
Basophils Relative: 1 %
Eosinophils Absolute: 0.1 10*3/uL (ref 0.0–0.5)
Eosinophils Relative: 1 %
HCT: 46.2 % — ABNORMAL HIGH (ref 36.0–46.0)
Hemoglobin: 16.2 g/dL — ABNORMAL HIGH (ref 12.0–15.0)
Immature Granulocytes: 0 %
Lymphocytes Relative: 15 %
Lymphs Abs: 1.5 10*3/uL (ref 0.7–4.0)
MCH: 31.6 pg (ref 26.0–34.0)
MCHC: 35.1 g/dL (ref 30.0–36.0)
MCV: 90.2 fL (ref 80.0–100.0)
Monocytes Absolute: 1.1 10*3/uL — ABNORMAL HIGH (ref 0.1–1.0)
Monocytes Relative: 11 %
Neutro Abs: 7.4 10*3/uL (ref 1.7–7.7)
Neutrophils Relative %: 72 %
Platelets: 262 10*3/uL (ref 150–400)
RBC: 5.12 MIL/uL — ABNORMAL HIGH (ref 3.87–5.11)
RDW: 12.5 % (ref 11.5–15.5)
WBC: 10.2 10*3/uL (ref 4.0–10.5)
nRBC: 0 % (ref 0.0–0.2)

## 2021-01-03 LAB — URINALYSIS, ROUTINE W REFLEX MICROSCOPIC
Bilirubin Urine: NEGATIVE
Glucose, UA: NEGATIVE mg/dL
Ketones, ur: NEGATIVE mg/dL
Leukocytes,Ua: NEGATIVE
Nitrite: NEGATIVE
Protein, ur: 30 mg/dL — AB
Specific Gravity, Urine: 1.01 (ref 1.005–1.030)
pH: 6 (ref 5.0–8.0)

## 2021-01-03 LAB — URINALYSIS, MICROSCOPIC (REFLEX)

## 2021-01-03 LAB — LIPASE, BLOOD: Lipase: 35 U/L (ref 11–51)

## 2021-01-03 MED ORDER — SODIUM CHLORIDE 0.9 % IV BOLUS
1000.0000 mL | Freq: Once | INTRAVENOUS | Status: AC
  Administered 2021-01-03: 1000 mL via INTRAVENOUS

## 2021-01-03 MED ORDER — AMOXICILLIN-POT CLAVULANATE 875-125 MG PO TABS: 1.0000 | ORAL_TABLET | Freq: Two times a day (BID) | ORAL | 0 refills | Status: DC

## 2021-01-03 NOTE — ED Notes (Signed)
Sent by primary care MD to our ED for cont c/o abd pain, states has been ongoing since Thanksgiving. Pain is intermittent in intensity, has intermittent nausea and vomiting. Pain is generalized throughout abd. Denies any fevers. Last night was the most recent she had a vomiting episode x 1. Last normal BM has been "a long time ago", also experiencing some constipation. Utilizing stool softeners, and increasing PO fluids. Denies any abdominal surgical hx. Abdominal pain that radiates to lower back pain that is also constant, back pain has been going for years per pt statement. ED PA at bedside.

## 2021-01-03 NOTE — ED Triage Notes (Signed)
Pt c/o abd pain, back pain since Nov 2021-states she was sent from PCP today-NAD-steady gait

## 2021-01-03 NOTE — Discharge Instructions (Addendum)
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  The etiology for this is unclear, you will need to follow-up with your primary care provider ASAP for ongoing evaluation and management.  You may require anticoagulation if underlying causes a hypercoagulable state (increased risk of clotting) such as antiphospholipid syndrome.  However, until that determination can be made, do not feel as though emergent treatment is warranted.  In the meantime, recommend continued pain control as needed.  I recommend 600 mg ibuprofen every 6 hours as needed.    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  Given that you are tender in that area on my examination, will treat with antibiotics for diverticulitis.  Please take them, as directed.  You also had evidence of renal impairment, but given that you are followed at [facility], unclear as to whether or not this is new or chronic.  You were given fluids here in the ED.  You will need to have lab recheck in the next few days.  If your blood cultures are positive, return to ED immediately.    Return to the ED or seek immediate medical attention should you experience any new or worsening symptoms.

## 2021-01-03 NOTE — ED Provider Notes (Signed)
[facility] EMERGENCY DEPARTMENT Provider Note   CSN: [id number] Arrival date & time: 01/03/21  1359     History Chief Complaint  Patient presents with  . Abdominal Pain    [name] [name] is a 65 y.o. female with no relevant past medical history presents the ED sent from PCP for a 42-month history of abdominal and low back pain.  Patient is followed by [facility].  On my examination, patient reports that she has been experiencing 7 out of 10 intermittent generalized abdominal and low back discomfort for the past few months. She states that she will also have intermittent associated nausea and emesis. She had one episode of nonbloody emesis last night. She also endorses mild constipation for which she has been increasing her oral hydration and taking MiraLAX intermittently. She states that she was supposed to provide Cologuard testing to her primary care provider last month, but has not yet got around to it. She states that her colonoscopy is overdue. She denies any history of abdominal or pelvic surgeries. She also denies any fevers or chills, recent illness or infection, chest pain, cough, shortness of breath, increased urinary frequency or dysuria, hematuria, melena, hematochezia, or other recent changes in her bowel habits.  I asked her why she was advised to come here to the ED and she states that it is because she has had to miss work due to her intermittent abdominal and low back discomfort. I inquired as to why her primary care provider was unable to obtain outpatient CT imaging if they were concerned, especially given her chronicity, but she shrugged and states that they were unable to fit her in and instead advised her to come to the ED for evaluation.  HPI     Past Medical History:  Diagnosis Date  . Asthma   . Eczema   . Recurrent upper respiratory infection (URI)     Patient Active Problem List   Diagnosis Date Noted  . Other allergic rhinitis  12/12/2020  . Dyshidrotic eczema 12/12/2020  . Heartburn 12/12/2020  . HYPERCHOLESTEROLEMIA 01/20/2008  . DEPRESSION 01/20/2008  . HYPERTENSION 01/20/2008  . Asthma, not well controlled 01/20/2008  . HIATAL HERNIA 01/20/2008  . DIARRHEA 01/20/2008  . GASTROESOPHAGEAL REFLUX DISEASE 10/20/2007    Past Surgical History:  Procedure Laterality Date  . BREAST EXCISIONAL BIOPSY  2000?   Pt states exc bx, benign, for milk duct, unsure which side, cannot see scar  . SINOSCOPY       OB History   No obstetric history on file.     Family History  Family history unknown: Yes    Social History   Tobacco Use  . Smoking status: Never Smoker  . Smokeless tobacco: Never Used  Vaping Use  . Vaping Use: Never used  Substance Use Topics  . Alcohol use: Yes    Comment: occ  . Drug use: Never    Home Medications Prior to Admission medications   Medication Sig Start Date End Date Taking? Authorizing Provider  amoxicillin-clavulanate (AUGMENTIN) 875-125 MG tablet Take 1 tablet by mouth every 12 (twelve) hours. 01/03/21  Yes Lorelee New, PA-C  albuterol (VENTOLIN HFA) 108 (90 Base) MCG/ACT inhaler Inhale into the lungs. 10/31/20   [provider]  Azelastine HCl 0.15 % SOLN Place 1-2 sprays into the nose 2 (two) times daily as needed (drainage). Patient not taking: Reported on 12/12/2020 12/12/20   Ellamae Sia, DO  Black Cohosh 540 MG CAPS See admin instructions.  [provider]  budesonide-formoterol (SYMBICORT) 80-4.5 MCG/ACT inhaler Inhale 2 puffs into the lungs in the morning and at bedtime. with spacer and rinse mouth afterwards. Patient not taking: Reported on 12/12/2020 12/12/20   Ellamae Sia, DO  buPROPion (WELLBUTRIN XL) 150 MG 24 hr tablet Take 450 mg by mouth daily. 10/08/20   [provider]  Calcium Carbonate-Vit D-Min (CALCIUM 1200 PO)     [provider]  cetirizine (ZYRTEC) 10 MG tablet 1 tablet    [provider]   Cholecalciferol (VITAMIN D3 PO)     [provider]  clobetasol cream (TEMOVATE) 0.05 % 1 application to affected area    [provider]  fluticasone (FLONASE) 50 MCG/ACT nasal spray Place into both nostrils. 11/04/20   [provider]  lithium 300 MG tablet 1 a day for a week, then 2/day. Get lab after on 2/day for a week Patient not taking: Reported on 12/12/2020 01/30/19   Shugart, Mat Carne, PA-C  montelukast (SINGULAIR) 10 MG tablet Take 10 mg by mouth daily. 12/12/20   [provider]  Multiple Vitamin (MULTIVITAMINS PO) 1 po    [provider]  olmesartan (BENICAR) 20 MG tablet Take 20 mg by mouth daily. 09/19/20   [provider]  simvastatin (ZOCOR) 20 MG tablet Take 20 mg by mouth at bedtime. 11/21/20   [provider]  valACYclovir (VALTREX) 1000 MG tablet 2 tablets    [provider]  VYVANSE 30 MG capsule Take 30 mg by mouth at bedtime. 12/04/20   [provider]  White Petrolatum-Mineral Oil (SYSTANE NIGHTTIME) OINT See admin instructions.    [provider]  Benay Spice 5 % SOLN  12/10/20   [provider]    Allergies    Omeprazole and Zantac [ranitidine]  Review of Systems   Review of Systems  All other systems reviewed and are negative.   Physical Exam Updated Vital Signs BP (!) 144/72   Pulse (!) 102   Temp 99.3 F (37.4 C) (Oral)   Resp 19   Ht 5\' 4"  (1.626 m)   Wt 78.5 kg   SpO2 97%   BMI 29.70 kg/m   Physical Exam Vitals and nursing note reviewed. Exam conducted with a chaperone present.  Constitutional:      Appearance: Normal appearance.  HENT:     Head: Normocephalic and atraumatic.  Eyes:     General: No scleral icterus.    Conjunctiva/sclera: Conjunctivae normal.  Cardiovascular:     Rate and Rhythm: Regular rhythm. Tachycardia present.     Pulses: Normal pulses.  Pulmonary:     Effort: Pulmonary effort is normal. No respiratory distress.  Abdominal:      General: Abdomen is flat. There is no distension.     Palpations: Abdomen is soft.     Tenderness: There is abdominal tenderness. There is left CVA tenderness.     Comments: Patient with generalized tenderness to palpation. Soft, nondistended abdomen. No peritoneal signs. TTP most notably in LLQ. No overlying skin changes. Positive left-sided CVAT, negative right-sided CVAT. No midline spinal tenderness.  Musculoskeletal:     Right lower leg: No edema.     Left lower leg: No edema.  Skin:    General: Skin is dry.     Capillary Refill: Capillary refill takes less than 2 seconds.  Neurological:     Mental Status: She is alert and oriented to person, place, and time.     GCS: GCS eye subscore is  4. GCS verbal subscore is 5. GCS motor subscore is 6.  Psychiatric:        Mood and Affect: Mood normal.        Behavior: Behavior normal.        Thought Content: Thought content normal.     ED Results / Procedures / Treatments   Labs (all labs ordered are listed, but only abnormal results are displayed) Labs Reviewed  COMPREHENSIVE METABOLIC PANEL - Abnormal; Notable for the following components:      Result Value   Glucose, Bld 109 (*)    BUN 31 (*)    Creatinine, Ser 2.23 (*)    GFR, Estimated 24 (*)    All other components within normal limits  CBC WITH DIFFERENTIAL/PLATELET - Abnormal; Notable for the following components:   RBC 5.12 (*)    Hemoglobin 16.2 (*)    HCT 46.2 (*)    Monocytes Absolute 1.1 (*)    All other components within normal limits  URINALYSIS, ROUTINE W REFLEX MICROSCOPIC - Abnormal; Notable for the following components:   Hgb urine dipstick TRACE (*)    Protein, ur 30 (*)    All other components within normal limits  URINALYSIS, MICROSCOPIC (REFLEX) - Abnormal; Notable for the following components:   Bacteria, UA FEW (*)    All other components within normal limits  CULTURE, BLOOD (ROUTINE X 2)  CULTURE, BLOOD (ROUTINE X 2)  LIPASE, BLOOD     EKG None  Radiology CT ABDOMEN PELVIS WO CONTRAST  Result Date: 01/03/2021 CLINICAL DATA:  65 year old female with left lower quadrant abdominal pain. Concern for acute diverticulitis. EXAM: CT ABDOMEN AND PELVIS WITHOUT CONTRAST TECHNIQUE: Multidetector CT imaging of the abdomen and pelvis was performed following the standard protocol without IV contrast. COMPARISON:  CT abdomen pelvis dated 01/31/2011. FINDINGS: Evaluation of this exam is limited in the absence of intravenous contrast. Lower chest: The visualized lung bases are clear. No intra-abdominal free air or free fluid. Hepatobiliary: Diffuse fatty liver. No intrahepatic biliary dilatation. The gallbladder is unremarkable. Pancreas: Unremarkable. No pancreatic ductal dilatation or surrounding inflammatory changes. Spleen: Focal area of triangular hypodensity along the posterior spleen consistent with an infarct (15/2 and 66/5). Adrenals/Urinary Tract: The adrenal glands unremarkable. The kidneys, visualized ureters, and urinary bladder appear unremarkable. Stomach/Bowel: Small scattered sigmoid diverticula. Faint minimal perisigmoid haziness (coronal 47/5), likely chronic. Mild early diverticulitis is less likely but not excluded. Clinical correlation is recommended. There is moderate stool throughout the colon. There is no bowel obstruction. The appendix is normal. Vascular/Lymphatic: Moderate aortoiliac atherosclerotic disease. The IVC is unremarkable. No portal venous gas. There is no adenopathy. Reproductive: The uterus and ovaries are grossly unremarkable. No adnexal masses. Other: None Musculoskeletal: No acute or significant osseous findings. IMPRESSION: 1. Small splenic infarct. 2. Small scattered sigmoid diverticula. Faint minimal perisigmoid haziness, likely chronic. Mild early diverticulitis is less likely. No bowel obstruction. Normal appendix. 3. Fatty liver. 4. Aortic Atherosclerosis (ICD10-I70.0). Electronically Signed   By: Elgie Collard M.D.   On: 01/03/2021 15:59    Procedures Procedures   Medications Ordered in ED Medications  sodium chloride 0.9 % bolus 1,000 mL (1,000 mLs Intravenous New Bag/Given 01/03/21 1627)    ED Course  I have reviewed the triage vital signs and the nursing notes.  Pertinent labs & imaging results that were available during my care of the patient were reviewed by me and considered in my medical decision making (see chart for details).    MDM Rules/Calculators/A&P  [name] [name] was evaluated in Emergency Department on 01/03/2021 for the symptoms described in the history of present illness. She was evaluated in the context of the global COVID-19 pandemic, which necessitated consideration that the patient might be at risk for infection with the SARS-CoV-2 virus that causes COVID-19. Institutional protocols and algorithms that pertain to the evaluation of patients at risk for COVID-19 are in a state of rapid change based on information released by regulatory bodies including the CDC and federal and state organizations. These policies and algorithms were followed during the patient's care in the ED.  I personally reviewed patient's medical chart and all notes from triage and staff during today's encounter. I have also ordered and reviewed all labs and imaging that I felt to be medically necessary in the evaluation of this patient's complaints and with consideration of their with their physical exam. If needed, translation services were available and utilized.   Patient with borderline epistaxis and mild tachycardia 104 bpm.  Given her tenderness in left lower quadrant in addition to her nausea and emesis, feel as though CT imaging is warranted.  We will need to proceed without contrast given impaired renal function.  Unsure as to chronicity given most recent labs from over 10 years ago.  I cannot see labs obtained by her primary care provider at [facility].   CT abdomen pelvis is personally reviewed and demonstrates a focal area of triangular hypodensity along the posterior spleen consistent with infarct.  Will obtain blood cultures for assessment of possible endocarditis.  She denies any hx prosthetic valve or IVDA.  Patient also has small scattered sigmoid diverticula with perisigmoid haziness, unclear as to acute versus chronic.  Given her left lower quadrant abdominal tenderness on my exam and consistent with her history, will treat with Augmentin x7 days and encouraged her to follow-up with her primary care provider.  Discussed case with Dr. Renaye Rakers who agrees with assessment and plan.  All of the evaluation and work-up results were discussed with the patient and any family at bedside.  Patient and/or family were informed that while patient is appropriate for discharge at this time, some medical emergencies may only develop or become detectable after a period of time.  I specifically instructed patient and/or family to return to return to the ED or seek immediate medical attention for any new or worsening symptoms.  They were provided opportunity to ask any additional questions and have none at this time.  Prior to discharge patient is feeling well, agreeable with plan for discharge home.  They have expressed understanding of verbal discharge instructions as well as return precautions and are agreeable to the plan.    Final Clinical Impression(s) / ED Diagnoses Final diagnoses:  Splenic infarct  Diverticulitis    Rx / DC Orders ED Discharge Orders         Ordered    amoxicillin-clavulanate (AUGMENTIN) 875-125 MG tablet  Every 12 hours        01/03/21 1708           Lorelee New, PA-C 01/03/21 1709    Maia Plan, MD 01/03/21 [phone]

## 2021-01-08 LAB — CULTURE, BLOOD (ROUTINE X 2)
Culture: NO GROWTH
Culture: NO GROWTH
Special Requests: ADEQUATE
Special Requests: ADEQUATE

## 2021-02-09 NOTE — Progress Notes (Signed)
Follow Up Note  RE: [name] [name] [name] MRN: [id number] DOB: 1956-06-03 Date of Office Visit: 02/10/2021  Referring provider: Sigmund Hazel, MD Primary care provider: Sigmund Hazel, MD  Chief Complaint: Asthma (Blood and dry nose )  History of Present Illness: I had the pleasure of seeing [name] [name] [name] for a follow up visit at the [facility] on 02/10/2021. She is a 65 y.o. female, who is being followed for asthma, allergic rhinoconjunctivitis, heartburn and dyshidrotic eczema. Her previous allergy office visit was on 12/12/2020 with Dr. Selena Batten. Today is a regular follow up visit.  Asthma ACT score 21. Currently on Symbicort 1 puff twice a day and rinsing mouth after each use. Did not take 2 puffs twice a day.  Not had to use albuterol the last few weeks.  Denies any ER/urgent care visits or prednisone use since the last visit.  Allergic rhino conjunctivitis: Still has some PND, itchy eyes, sneezing and coughing. Stopped nasal sprays as it was causing nosebleeds. Taking Singulair at night and zyrtec 10mg  daily.  Dyshidrotic eczema Only had to use Eucrisa a few times with good benefit.  Heartburn Stable.   Patient had splenic infract, diverticulitis and renal issues.   Assessment and Plan: [name] is a 65 y.o. female with: Moderate persistent asthma without complication Past history - Diagnosed with asthma in her 30s.  Currently on Qvar unknown dose 1 puff twice a day for 10+ years, Singulair and albuterol as needed.  Patient has daily symptoms however does not like to use albuterol during these episodes.  History of reflux and taking Nexium daily. 2022 spirometry was normal with no improvement in FEV1 post bronchodilator treatment.  Clinically feeling slightly improved. Interim history - Doing much better with Symbicort.   ACT score 21.  Today's spirometry was normal. . Daily controller medication(s): continue Symbicort 2023 1 puffs twice a day with  spacer and rinse mouth afterwards. o If you notice symptoms, you can take up to 2 puffs twice a day of the Symbicort.  . May use albuterol rescue inhaler 2 puffs every 4 to 6 hours as needed for shortness of breath, chest tightness, coughing, and wheezing. May use albuterol rescue inhaler 2 puffs 5 to 15 minutes prior to strenuous physical activities. Monitor frequency of use.   Seasonal and perennial allergic rhinoconjunctivitis Past history - Perennial rhinoconjunctivitis symptoms with worsening in the spring and fall for the last 40+ years.  Using Flonase, Singulair and Zyrtec with some benefit.  3-4 sinus infections per year. 2022 skin testing showed: Positive to grass, weed, ragweed, trees, dust mites, cat, dogs, mold, feathers, cockroaches.  Borderline to horse.  Interim history - Nasal sprays caused epistaxis so stopped. Still having symptoms.   Continue environmental control measures.  Continue Singulair (montelukast) 10mg  daily at night.  May use over the counter antihistamines such as Zyrtec (cetirizine), Claritin (loratadine), Allegra (fexofenadine), or Xyzal (levocetirizine) daily as needed.  Demonstrated proper nasal spray use.   May use Flonase (fluticasone) nasal spray 1 spray per nostril twice a day as needed for nasal congestion.   May use azelastine nasal spray 1-2 sprays per nostril twice a day as needed for runny nose/drainage.  Nasal saline spray (i.e., Simply Saline) or nasal saline lavage (i.e., NeilMed) is recommended as needed and prior to medicated nasal sprays.  Use refresh/systane eye drops as needed.   Read about allergy injections - handout given.   Call the office if you are ready to start before the  next visit.   Dyshidrotic eczema Eucrisa helps.  Continue proper skin care.   May use Eucrisa (crisaborole) 2% ointment twice a day on mild eczema flares on the hands.  Heartburn  Continue Nexium daily as prescribed.  Return in about 3 months (around  05/13/2021).  Meds ordered this encounter  Medications  . budesonide-formoterol (SYMBICORT) 80-4.5 MCG/ACT inhaler    Sig: Inhale 1 puff into the lungs in the morning and at bedtime. with spacer and rinse mouth afterwards.    Dispense:  1 each    Refill:  5  . albuterol (VENTOLIN HFA) 108 (90 Base) MCG/ACT inhaler    Sig: Inhale 2 puffs into the lungs every 4 (four) hours as needed for wheezing or shortness of breath (coughing). 2 puffs every 4 to 6 hours as needed    Dispense:  18 g    Refill:  1   Lab Orders  No laboratory test(s) ordered today    Diagnostics: Spirometry:  Tracings reviewed. Her effort: Good reproducible efforts. FVC: 2.77L FEV1: 2.38L, 99% predicted FEV1/FVC ratio: 86% Interpretation: Spirometry consistent with normal pattern.  Please see scanned spirometry results for details.  Medication List:  Current Outpatient Medications  Medication Sig Dispense Refill  . amoxicillin-clavulanate (AUGMENTIN) 875-125 MG tablet Take 1 tablet by mouth every 12 (twelve) hours. 14 tablet 0  . Black Cohosh 540 MG CAPS See admin instructions.    Marland Kitchen buPROPion (WELLBUTRIN XL) 150 MG 24 hr tablet Take 450 mg by mouth daily.    . Calcium Carbonate-Vit D-Min (CALCIUM 1200 PO)     . cetirizine (ZYRTEC) 10 MG tablet 1 tablet    . Cholecalciferol (VITAMIN D3 PO)     . clobetasol cream (TEMOVATE) 0.05 % 1 application to affected area    . lithium 300 MG tablet 1 a day for a week, then 2/day. Get lab after on 2/day for a week 60 tablet 0  . montelukast (SINGULAIR) 10 MG tablet Take 10 mg by mouth daily.    . Multiple Vitamin (MULTIVITAMINS PO) 1 po    . olmesartan (BENICAR) 20 MG tablet Take 20 mg by mouth daily.    . simvastatin (ZOCOR) 20 MG tablet Take 20 mg by mouth at bedtime.    . valACYclovir (VALTREX) 1000 MG tablet 2 tablets    . VYVANSE 30 MG capsule Take 30 mg by mouth at bedtime.    Cliffton Asters Petrolatum-Mineral Oil (SYSTANE NIGHTTIME) OINT See admin instructions.    Marland Kitchen  XIIDRA 5 % SOLN     . albuterol (VENTOLIN HFA) 108 (90 Base) MCG/ACT inhaler Inhale 2 puffs into the lungs every 4 (four) hours as needed for wheezing or shortness of breath (coughing). 2 puffs every 4 to 6 hours as needed 18 g 1  . Azelastine HCl 0.15 % SOLN Place 1-2 sprays into the nose 2 (two) times daily as needed (drainage). (Patient not taking: Reported on 02/10/2021) 30 mL 5  . budesonide-formoterol (SYMBICORT) 80-4.5 MCG/ACT inhaler Inhale 1 puff into the lungs in the morning and at bedtime. with spacer and rinse mouth afterwards. 1 each 5  . fluticasone (FLONASE) 50 MCG/ACT nasal spray Place into both nostrils. (Patient not taking: Reported on 02/10/2021)     No current facility-administered medications for this visit.   Allergies: Allergies  Allergen Reactions  . Omeprazole Other (See Comments)  . Zantac [Ranitidine] Other (See Comments)   I reviewed her past medical history, social history, family history, and environmental history and no significant changes  have been reported from her previous visit.  Review of Systems  Constitutional: Negative for fever.  HENT: Positive for postnasal drip and sneezing. Negative for congestion and rhinorrhea.   Eyes: Positive for itching.  Respiratory: Positive for cough. Negative for chest tightness, shortness of breath and wheezing.   Cardiovascular: Negative for chest pain.  Gastrointestinal: Negative for abdominal pain.  Genitourinary: Negative for difficulty urinating.  Skin: Negative for rash.  Allergic/Immunologic: Positive for environmental allergies.  Neurological: Negative for headaches.   Objective: BP 124/80   Pulse 84   Temp (!) 97.3 F (36.3 C)   Resp 18   Ht 5\' 3"  (1.6 m)   Wt 171 lb 3.2 oz (77.7 kg)   SpO2 98%   BMI 30.33 kg/m  Body mass index is 30.33 kg/m. Physical Exam Vitals and nursing note reviewed.  Constitutional:      Appearance: Normal appearance. She is well-developed.  HENT:     Head: Normocephalic  and atraumatic.     Right Ear: Tympanic membrane and external ear normal.     Left Ear: Tympanic membrane and external ear normal.     Nose: Nose normal.     Mouth/Throat:     Mouth: Mucous membranes are moist.     Pharynx: Oropharynx is clear.  Eyes:     Conjunctiva/sclera: Conjunctivae normal.  Cardiovascular:     Rate and Rhythm: Normal rate and regular rhythm.     Heart sounds: Normal heart sounds. No murmur heard. No friction rub. No gallop.   Pulmonary:     Effort: Pulmonary effort is normal.     Breath sounds: Normal breath sounds. No wheezing, rhonchi or rales.  Musculoskeletal:     Cervical back: Neck supple.  Skin:    General: Skin is warm and dry.     Comments: Dry hands with areas of mild erythema.  Neurological:     Mental Status: She is alert and oriented to person, place, and time.  Psychiatric:        Behavior: Behavior normal.    Previous notes and tests were reviewed. The plan was reviewed with the patient/family, and all questions/concerned were addressed.  It was my pleasure to see Tanetta today and participate in her care. Please feel free to contact me with any questions or concerns.  Sincerely,  Steward Drone, DO Allergy & Immunology  [facility] office: [phone] [facility] office: [phone]

## 2021-02-10 ENCOUNTER — Other Ambulatory Visit: Payer: Self-pay

## 2021-02-10 ENCOUNTER — Encounter: Payer: Self-pay | Admitting: Allergy

## 2021-02-10 ENCOUNTER — Ambulatory Visit: Payer: BC Managed Care – PPO | Admitting: Allergy

## 2021-02-10 VITALS — BP 124/80 | HR 84 | Temp 97.3°F | Resp 18 | Ht 63.0 in | Wt 171.2 lb

## 2021-02-10 DIAGNOSIS — H1013 Acute atopic conjunctivitis, bilateral: Secondary | ICD-10-CM

## 2021-02-10 DIAGNOSIS — J302 Other seasonal allergic rhinitis: Secondary | ICD-10-CM

## 2021-02-10 DIAGNOSIS — R12 Heartburn: Secondary | ICD-10-CM

## 2021-02-10 DIAGNOSIS — J3089 Other allergic rhinitis: Secondary | ICD-10-CM

## 2021-02-10 DIAGNOSIS — L301 Dyshidrosis [pompholyx]: Secondary | ICD-10-CM

## 2021-02-10 DIAGNOSIS — J4540 Moderate persistent asthma, uncomplicated: Secondary | ICD-10-CM | POA: Insufficient documentation

## 2021-02-10 MED ORDER — ALBUTEROL SULFATE HFA 108 (90 BASE) MCG/ACT IN AERS: 2.0000 | INHALATION_SPRAY | RESPIRATORY_TRACT | 1 refills | Status: DC | PRN

## 2021-02-10 MED ORDER — BUDESONIDE-FORMOTEROL FUMARATE 80-4.5 MCG/ACT IN AERO: 1.0000 | INHALATION_SPRAY | Freq: Two times a day (BID) | RESPIRATORY_TRACT | 5 refills | Status: DC

## 2021-02-10 NOTE — Patient Instructions (Addendum)
Asthma: . Daily controller medication(s): continue Symbicort 1 puffs twice a day with spacer and rinse mouth afterwards. o If you notice symptoms, you can take up to 2 puffs twice a day of the Symbicort.  . May use albuterol rescue inhaler 2 puffs every 4 to 6 hours as needed for shortness of breath, chest tightness, coughing, and wheezing. May use albuterol rescue inhaler 2 puffs 5 to 15 minutes prior to strenuous physical activities. Monitor frequency of use.  . Asthma control goals:  o Full participation in all desired activities (may need albuterol before activity) o Albuterol use two times or less a week on average (not counting use with activity) o Cough interfering with sleep two times or less a month o Oral steroids no more than once a year o No hospitalizations  Environmental allergies:   Past skin testing showed: Positive to grass, weed, ragweed, trees, dust mites, cat, dogs, mold, feathers, cockroaches.  Borderline to horse.   Continue environmental control measures.  Continue Singulair (montelukast) 10mg  daily at night.  May use over the counter antihistamines such as Zyrtec (cetirizine), Claritin (loratadine), Allegra (fexofenadine), or Xyzal (levocetirizine) daily as needed.   May use Flonase (fluticasone) nasal spray 1 spray per nostril twice a day as needed for nasal congestion.   May use azelastine nasal spray 1-2 sprays per nostril twice a day as needed for runny nose/drainage.  Nasal saline spray (i.e., Simply Saline) or nasal saline lavage (i.e., NeilMed) is recommended as needed and prior to medicated nasal sprays.  Use refresh/systane eye drops as needed.   Read about allergy injections - handout given.   Call the office if you are ready to start before the next visit.  Eczema:  May use Eucrisa (crisaborole) 2% ointment twice a day on mild eczema flares on the hands. This is a non-steroid ointment.   If it burns, place the medication in the  refrigerator.   Apply a thin layer of moisturizer and then apply the Eucrisa on top of it.  Follow up in 3 months or sooner if needed.   Skin care recommendations  Bath time: . Always use lukewarm water. AVOID very hot or cold water. Keep bathing time to 5-10 minutes. . Do NOT use bubble bath. . Use a mild soap and use just enough to wash the dirty areas. . Do NOT scrub skin vigorously.  . After bathing, pat dry your skin with a towel. Do NOT rub or scrub the skin.  Moisturizers and prescriptions:  . ALWAYS apply moisturizers immediately after bathing (within 3 minutes). This helps to lock-in moisture. . Use the moisturizer several times a day over the whole body. Marland Kitchen summer moisturizers include: Aveeno, CeraVe, Cetaphil. [place]i [place] winter moisturizers include: Aquaphor, Vaseline, Cerave, Cetaphil, Eucerin, Vanicream. . When using moisturizers along with medications, the moisturizer should be applied about one hour after applying the medication to prevent diluting effect of the medication or moisturize around where you applied the medications. When not using medications, the moisturizer can be continued twice daily as maintenance.  Laundry and clothing: . Avoid laundry products with added color or perfumes. . Use unscented hypo-allergenic laundry products such as Tide free, Cheer free & gentle, and All free and clear.  . If the skin still seems dry or sensitive, you can try double-rinsing the clothes. . Avoid tight or scratchy clothing such as wool. . Do not use fabric softeners or dyer sheets.  Reducing Pollen Exposure . Pollen seasons: trees (spring), grass (  summer) and ragweed/weeds (fall). Marland Kitchen Keep windows closed in your home and car to lower pollen exposure.  Lilian Kapur air conditioning in the bedroom and throughout the house if possible.  . Avoid going out in dry windy days - especially early morning. . Pollen counts are highest between 5 - 10 AM and on dry, hot and windy days.   . Save outside activities for late afternoon or after a heavy rain, when pollen levels are lower.  . Avoid mowing of grass if you have grass pollen allergy. Marland Kitchen Be aware that pollen can also be transported indoors on people and pets.  . Dry your clothes in an automatic dryer rather than hanging them outside where they might collect pollen.  . Rinse hair and eyes before bedtime. Control of House Dust Mite Allergen . Dust mite allergens are a common trigger of allergy and asthma symptoms. While they can be found throughout the house, these microscopic creatures thrive in warm, humid environments such as bedding, upholstered furniture and carpeting. . Because so much time is spent in the bedroom, it is essential to reduce mite levels there.  . Encase pillows, mattresses, and box springs in special allergen-proof fabric covers or airtight, zippered plastic covers.  . Bedding should be washed weekly in hot water (130 F) and dried in a hot dryer. Allergen-proof covers are available for comforters and pillows that can't be regularly washed.  Reyes Ivan the allergy-proof covers every few months. Minimize clutter in the bedroom. Keep pets out of the bedroom.  Marland Kitchen Keep humidity less than 50% by using a dehumidifier or air conditioning. You can buy a humidity measuring device called a hygrometer to monitor this.  . If possible, replace carpets with hardwood, linoleum, or washable area rugs. If that's not possible, vacuum frequently with a vacuum that has a HEPA filter. . Remove all upholstered furniture and non-washable window drapes from the bedroom. . Remove all non-washable stuffed toys from the bedroom.  Wash stuffed toys weekly. Pet Allergen Avoidance: . Contrary to popular opinion, there are no "hypoallergenic" breeds of dogs or cats. That is because people are not allergic to an animal's hair, but to an allergen found in the animal's saliva, dander (dead skin flakes) or urine. Pet allergy symptoms typically  occur within minutes. For some people, symptoms can build up and become most severe 8 to 12 hours after contact with the animal. People with severe allergies can experience reactions in public places if dander has been transported on the pet owners' clothing. Marland Kitchen Keeping an animal outdoors is only a partial solution, since homes with pets in the yard still have higher concentrations of animal allergens. . Before getting a pet, ask your allergist to determine if you are allergic to animals. If your pet is already considered part of your family, try to minimize contact and keep the pet out of the bedroom and other rooms where you spend a great deal of time. . As with dust mites, vacuum carpets often or replace carpet with a hardwood floor, tile or linoleum. . High-efficiency particulate air (HEPA) cleaners can reduce allergen levels over time. . While dander and saliva are the source of cat and dog allergens, urine is the source of allergens from rabbits, hamsters, mice and [place] pigs; so ask a non-allergic family member to clean the animal's cage. . If you have a pet allergy, talk to your allergist about the potential for allergy immunotherapy (allergy shots). This strategy can often provide long-term relief. Mold  Control . Mold and fungi can grow on a variety of surfaces provided certain temperature and moisture conditions exist.  . Outdoor molds grow on plants, decaying vegetation and soil. The major outdoor mold, Alternaria and Cladosporium, are found in very high numbers during hot and dry conditions. Generally, a late summer - fall peak is seen for common outdoor fungal spores. Rain will temporarily lower outdoor mold spore count, but counts rise rapidly when the rainy period ends. . The most important indoor molds are Aspergillus and Penicillium. Dark, humid and poorly ventilated basements are ideal sites for mold growth. The next most common sites of mold growth are the bathroom and the  kitchen. Outdoor (Seasonal) Mold Control . Use air conditioning and keep windows closed. . Avoid exposure to decaying vegetation. Marland Kitchen Avoid leaf raking. . Avoid grain handling. . Consider wearing a face mask if working in moldy areas.  Indoor (Perennial) Mold Control  . Maintain humidity below 50%. . Get rid of mold growth on hard surfaces with water, detergent and, if necessary, 5% bleach (do not mix with other cleaners). Then dry the area completely. If mold covers an area more than 10 square feet, consider hiring an indoor environmental professional. . For clothing, washing with soap and water is best. If moldy items cannot be cleaned and dried, throw them away. . Remove sources e.g. contaminated carpets. . Repair and seal leaking roofs or pipes. Using dehumidifiers in damp basements may be helpful, but empty the water and clean units regularly to prevent mildew from forming. All rooms, especially basements, bathrooms and kitchens, require ventilation and cleaning to deter mold and mildew growth. Avoid carpeting on concrete or damp floors, and storing items in damp areas. Cockroach Allergen Avoidance Cockroaches are often found in the homes of densely populated urban areas, schools or commercial buildings, but these creatures can lurk almost anywhere. This does not mean that you have a dirty house or living area. . Block all areas where roaches can enter the home. This includes crevices, wall cracks and windows.  . Cockroaches need water to survive, so fix and seal all leaky faucets and pipes. Have an exterminator go through the house when your family and pets are gone to eliminate any remaining roaches. Marland Kitchen Keep food in lidded containers and put pet food dishes away after your pets are done eating. Vacuum and sweep the floor after meals, and take out garbage and recyclables. Use lidded garbage containers in the kitchen. Wash dishes immediately after use and clean under stoves, refrigerators or  toasters where crumbs can accumulate. Wipe off the stove and other kitchen surfaces and cupboards regularly.

## 2021-02-10 NOTE — Assessment & Plan Note (Signed)
Eucrisa helps.  Continue proper skin care.   May use Eucrisa (crisaborole) 2% ointment twice a day on mild eczema flares on the hands.

## 2021-02-10 NOTE — Assessment & Plan Note (Signed)
Past history - Diagnosed with asthma in her 30s.  Currently on Qvar unknown dose 1 puff twice a day for 10+ years, Singulair and albuterol as needed.  Patient has daily symptoms however does not like to use albuterol during these episodes.  History of reflux and taking Nexium daily. 2022 spirometry was normal with no improvement in FEV1 post bronchodilator treatment.  Clinically feeling slightly improved. Interim history - Doing much better with Symbicort.   ACT score 21.  Today's spirometry was normal. . Daily controller medication(s): continue Symbicort 1 puffs twice a day with spacer and rinse mouth afterwards. o If you notice symptoms, you can take up to 2 puffs twice a day of the Symbicort.  . May use albuterol rescue inhaler 2 puffs every 4 to 6 hours as needed for shortness of breath, chest tightness, coughing, and wheezing. May use albuterol rescue inhaler 2 puffs 5 to 15 minutes prior to strenuous physical activities. Monitor frequency of use.

## 2021-02-10 NOTE — Assessment & Plan Note (Addendum)
Past history - Perennial rhinoconjunctivitis symptoms with worsening in the spring and fall for the last 40+ years.  Using Flonase, Singulair and Zyrtec with some benefit.  3-4 sinus infections per year. 2022 skin testing showed: Positive to grass, weed, ragweed, trees, dust mites, cat, dogs, mold, feathers, cockroaches.  Borderline to horse.  Interim history - Nasal sprays caused epistaxis so stopped. Still having symptoms.   Continue environmental control measures.  Continue Singulair (montelukast) 10mg  daily at night.  May use over the counter antihistamines such as Zyrtec (cetirizine), Claritin (loratadine), Allegra (fexofenadine), or Xyzal (levocetirizine) daily as needed.  Demonstrated proper nasal spray use.   May use Flonase (fluticasone) nasal spray 1 spray per nostril twice a day as needed for nasal congestion.   May use azelastine nasal spray 1-2 sprays per nostril twice a day as needed for runny nose/drainage.  Nasal saline spray (i.e., Simply Saline) or nasal saline lavage (i.e., NeilMed) is recommended as needed and prior to medicated nasal sprays.  Use refresh/systane eye drops as needed.   Read about allergy injections - handout given.   Call the office if you are ready to start before the next visit.

## 2021-02-10 NOTE — Assessment & Plan Note (Signed)
   Continue Nexium daily as prescribed.

## 2021-04-11 ENCOUNTER — Other Ambulatory Visit: Payer: Self-pay | Admitting: Allergy

## 2021-05-23 ENCOUNTER — Encounter: Payer: Self-pay | Admitting: Allergy & Immunology

## 2021-05-23 ENCOUNTER — Ambulatory Visit: Payer: BC Managed Care – PPO | Admitting: Allergy & Immunology

## 2021-05-23 ENCOUNTER — Other Ambulatory Visit: Payer: Self-pay

## 2021-05-23 VITALS — BP 130/80 | HR 86 | Temp 98.3°F | Resp 16 | Ht 64.0 in | Wt 170.4 lb

## 2021-05-23 DIAGNOSIS — J4540 Moderate persistent asthma, uncomplicated: Secondary | ICD-10-CM | POA: Diagnosis not present

## 2021-05-23 DIAGNOSIS — J3089 Other allergic rhinitis: Secondary | ICD-10-CM

## 2021-05-23 DIAGNOSIS — J302 Other seasonal allergic rhinitis: Secondary | ICD-10-CM

## 2021-05-23 DIAGNOSIS — L2089 Other atopic dermatitis: Secondary | ICD-10-CM

## 2021-05-23 MED ORDER — ALBUTEROL SULFATE HFA 108 (90 BASE) MCG/ACT IN AERS: 2.0000 | INHALATION_SPRAY | RESPIRATORY_TRACT | 1 refills | Status: DC | PRN

## 2021-05-23 MED ORDER — MONTELUKAST SODIUM 10 MG PO TABS: 10.0000 mg | ORAL_TABLET | Freq: Every day | ORAL | 5 refills | Status: DC

## 2021-05-23 MED ORDER — EUCRISA 2 % EX OINT: TOPICAL_OINTMENT | CUTANEOUS | 2 refills | Status: DC

## 2021-05-23 MED ORDER — TACROLIMUS 0.03 % EX OINT: TOPICAL_OINTMENT | Freq: Two times a day (BID) | CUTANEOUS | 0 refills | Status: DC

## 2021-05-23 MED ORDER — BUDESONIDE-FORMOTEROL FUMARATE 80-4.5 MCG/ACT IN AERO: INHALATION_SPRAY | RESPIRATORY_TRACT | 5 refills | Status: DC

## 2021-05-23 NOTE — Progress Notes (Signed)
FOLLOW UP  Date of Service/Encounter:  05/23/21   Assessment:   Moderate persistent asthma, uncomplicated   Seasonal and perennial allergic rhinitis (grasses, weeds, ragweed, trees, molds, dust mite, cat, dog, mixed feathers, horse, cockroach)  Flexural atopic dermatitis  Plan/Recommendations:    1. Moderate persistent asthma, uncomplicated - Lung function looked pretty good today. - I would go up to 2 puffs twice daily of the Symbicort until your [facility] is fixed. - We are going to get a complete blood count to see if you have elevated eosinophils, which would make it easier to get Dupixent approved.  - Daily controller medication(s): Symbicort 80/4.6mcg ONE PUFF twice daily with spacer - Prior to physical activity: albuterol 2 puffs 10-15 minutes before physical activity. - Rescue medications: albuterol 4 puffs every 4-6 hours as needed - Changes during respiratory infections or worsening symptoms: Increase Symbicort to 2 puffs twice daily for ONE TO TWO WEEKS. - Asthma control goals:  * Full participation in all desired activities (may need albuterol before activity) * Albuterol use two time or less a week on average (not counting use with activity) * Cough interfering with sleep two time or less a month * Oral steroids no more than once a year * No hospitalizations  2. Seasonal and perennial allergic rhinitis - Continue with the as needed use of the Flonase and Astelin. - Continue with Singulair 10 mg nightly. - Continue with Zyrtec 10 mg nightly (can change to as needed to decrease some of the nasal dryness).  3. Flexural atopic dermatitis - Continue with Eucrisa twice daily as needed for flares (safe to use over the entire body). - We we will send in a 100 g tube to a mail order pharmacy, which should be $10. - We are going to send in Protopic to use twice daily (non-steroidal and can be used over the entire body). - Call us in 1-2 weeks with an update (and let us know  that it did not work). - Consider the addition of Dupixent for control of your eczema as well as her asthma. - Handout provided. - We will refer you to dermatology for evaluation of the moles and other skin concerns.  4. Return in about 6 months (around 11/22/2021).    Subjective:   [name] [name] is a 65 y.o. 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  [name] [name] is a 65 y.o. female presenting for a follow up visit.  She was last seen in March 2022 by Dr. Selena Batten.  At that time, she was continued on Symbicort 80 mcg 1 puff twice daily, increasing to 2 puffs twice daily during flares for her asthma.  ACT score was 21.  For seasonal and perennial allergic rhinitis, she was continued on Singulair 10 mg nightly as well as Flonase and Astelin.  Allergy shots were discussed.  Her eczema  was controlled with Eucrisa as needed.  She was continued on Nexium for her heartburn.  Since the last visit, she has done fairly well.   Asthma/Respiratory Symptom History: She is using her Symbicort one puff twice daily with a spacer. She is mostly doing well with this. She has not been using her rescue inhaler much at all. She has been living in her home without [facility] recently and they are in the process of getting this all fixed. It is supposed to be fixed this coming week  sometime. She has been having some increased symptoms with this AC issue, but she has not increased her Symbicort to two puffs BID.  ACT is 20, indicating poor asthma control.   Allergic Rhinitis Symptom History: She remains on the Singulair daily as well as cetirizine 10mg  daily. She has been having some nasal dryness but she notes that this is much better when she drinks plenty of water. She has not been using Mucinex at all.   Eczema Symptom History: She has eczematous lesions on both her hands as well as her upper arms and her upper chest. She does use vaseline as well as shea butter and Eucerin Eczema most recently.  She has been on . She has also been on a couple of topical steroids including hydrocortisone as well as betamethasone. None of these have worked very effectively and have certainly not been long lasting at all. She is open to trying Dupixent. She has not needed any systemic steroids or antibiotics in quite some time for her skin. She does report a lot of itching and endorses a decreased quality of life due to the pruritis.    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 She is going to work for two more years and then retire. She has a a 26yo son ho currently works at [employer], but he has an [employer] in [place]. Mom is unsure what he is going to do in the long term, but he is not happy at the [facility] [facility] [employer].   Otherwise, there have been no changes to her past medical history, surgical history, family history, or social history.    Review of Systems  Constitutional: Negative.  Negative for chills, fever, malaise/fatigue and weight loss.  HENT: Negative.  Negative for congestion, ear discharge and ear pain.   Eyes:  Negative for pain, discharge and redness.  Respiratory:  Negative for cough, sputum production, shortness of breath and wheezing.   Cardiovascular: Negative.  Negative for chest pain and palpitations.  Gastrointestinal:  Negative  for abdominal pain, constipation, diarrhea, heartburn, nausea and vomiting.  Skin: Negative.  Negative for itching and rash.  Neurological:  Negative for dizziness and headaches.  Endo/Heme/Allergies:  Negative for environmental allergies. Does not bruise/bleed easily.      Objective:   Blood pressure 130/80, pulse 86, temperature 98.3 F (36.8 C), resp. rate 16, height 5\' 4"  (1.626 m), weight 170 lb 6.4 oz (77.3 kg), SpO2 95 %. Body mass index is 29.25 kg/m.   Physical Exam:  Physical Exam Vitals reviewed.  Constitutional:      Appearance: She is well-developed.     Comments: Very pleasant female. Cooperative with the exam.   HENT:     Head: Normocephalic and atraumatic.     Right Ear: Tympanic membrane, ear canal and external ear normal.     Left Ear: Tympanic membrane, ear canal and external ear normal.     Nose: No nasal deformity, septal  deviation, mucosal edema or rhinorrhea.     Right Turbinates: Enlarged, swollen and pale.     Left Turbinates: Enlarged, swollen and pale.     Right Sinus: No maxillary sinus tenderness or frontal sinus tenderness.     Left Sinus: No maxillary sinus tenderness or frontal sinus tenderness.     Comments: Dried rhinorrhea bilaterally with crusting mucous bilaterally. There are no polyps that I can appreciate.     Mouth/Throat:     Mouth: Mucous membranes are not pale and not dry.     Pharynx: Uvula midline.  Eyes:     General: Lids are normal. No allergic shiner.       Right eye: No discharge.        Left eye: No discharge.     Conjunctiva/sclera: Conjunctivae normal.     Right eye: Right conjunctiva is not injected. No chemosis.    Left eye: Left conjunctiva is not injected. No chemosis.    Pupils: Pupils are equal, round, and reactive to light.  Cardiovascular:     Rate and Rhythm: Normal rate and regular rhythm.     Heart sounds: Normal heart sounds.  Pulmonary:     Effort: Pulmonary effort is normal. No tachypnea, accessory muscle  usage or respiratory distress.     Breath sounds: Normal breath sounds. No wheezing, rhonchi or rales.     Comments: Moving air well in all lung fields. No increased work of breathing noted.  Chest:     Chest wall: No tenderness.  Lymphadenopathy:     Cervical: No cervical adenopathy.  Skin:    General: Skin is warm.     Capillary Refill: Capillary refill takes less than 2 seconds.     Coloration: Skin is not pale.     Findings: Rash present. No abrasion, erythema or petechiae. Rash is not papular, urticarial or vesicular.     Comments: She does have eczematous lesions on her bilateral face as well as upper and lower arms. She also has some dyshidrotic eczema on the upper chest.   Neurological:     Mental Status: She is alert.  Psychiatric:        Behavior: Behavior is cooperative.     Diagnostic studies:    Spirometry: results abnormal (FEV1: 2.22/89%, FVC: 2.54/78%, FEV1/FVC: 87%).    Spirometry consistent with possible restrictive disease.   Allergy Studies: none       Malachi Bonds, MD  [facility]

## 2021-05-23 NOTE — Patient Instructions (Signed)
1. Moderate persistent asthma, uncomplicated - Lung function looked pretty good today. - I would go up to 2 puffs twice daily of the Symbicort until your [facility] is fixed. - Daily controller medication(s): Symbicort 80/4.39mcg ONE PUFF twice daily with spacer - Prior to physical activity: albuterol 2 puffs 10-15 minutes before physical activity. - Rescue medications: albuterol 4 puffs every 4-6 hours as needed - Changes during respiratory infections or worsening symptoms: Increase Symbicort to 2 puffs twice daily for ONE TO TWO WEEKS. - Asthma control goals:  * Full participation in all desired activities (may need albuterol before activity) * Albuterol use two time or less a week on average (not counting use with activity) * Cough interfering with sleep two time or less a month * Oral steroids no more than once a year * No hospitalizations  2. Seasonal and perennial allergic rhinitis - Continue with the as needed use of the Flonase and Astelin. - Continue with Singulair 10 mg nightly. - Continue with Zyrtec 10 mg nightly (can change to as needed to decrease some of the nasal dryness).  3. Flexural atopic dermatitis - Continue with Eucrisa twice daily as needed for flares (safe to use over the entire body). - We we will send in a 100 g tube to a mail order pharmacy, which should be $10. - Consider the addition of Dupixent for control of your eczema as well as her asthma. - Handout provided. - We we will refer you to dermatology for evaluation of the moles and other skin concerns.  4. No follow-ups on file.    Please inform us of any Emergency Department visits, hospitalizations, or changes in symptoms. Call us before going to the ED for breathing or allergy symptoms since we might be able to fit you in for a sick visit. Feel free to contact us anytime with any questions, problems, or concerns.  It was a pleasure to meet you today!  Websites that have reliable patient information: 1.  American Academy of Asthma, Allergy, and Immunology: www.aaaai.org 2. Food Allergy Research and Education (FARE): foodallergy.org 3. Mothers of Asthmatics: http://www.asthmacommunitynetwork.org 4. American College of Allergy, Asthma, and Immunology: www.acaai.org   COVID-19 Vaccine Information can be found at: [email or web address] For questions related to vaccine distribution or appointments, please email [email or web address] or call [phone].   We realize that you might be concerned about having an allergic reaction to the COVID19 vaccines. To help with that concern, WE ARE OFFERING THE COVID19 VACCINES IN OUR OFFICE! Ask the front desk for dates!     "Like" [place] on Facebook and Instagram for our latest updates!      A healthy democracy works best when [employer] participate! Make sure you are registered to vote! If you have moved or changed any of your contact information, you will need to get this updated before voting!  In some cases, you MAY be able to register to vote online: [email or web address]

## 2021-05-24 ENCOUNTER — Ambulatory Visit: Payer: BC Managed Care – PPO | Admitting: Allergy

## 2021-05-24 ENCOUNTER — Telehealth: Payer: Self-pay | Admitting: *Deleted

## 2021-05-24 LAB — CBC WITH DIFFERENTIAL
Basophils Absolute: 0.1 10*3/uL (ref 0.0–0.2)
Basos: 1 %
EOS (ABSOLUTE): 0.2 10*3/uL (ref 0.0–0.4)
Eos: 2 %
Hematocrit: 47.5 % — ABNORMAL HIGH (ref 34.0–46.6)
Hemoglobin: 16 g/dL — ABNORMAL HIGH (ref 11.1–15.9)
Immature Grans (Abs): 0 10*3/uL (ref 0.0–0.1)
Immature Granulocytes: 0 %
Lymphocytes Absolute: 2.5 10*3/uL (ref 0.7–3.1)
Lymphs: 34 %
MCH: 30.6 pg (ref 26.6–33.0)
MCHC: 33.7 g/dL (ref 31.5–35.7)
MCV: 91 fL (ref 79–97)
Monocytes Absolute: 0.6 10*3/uL (ref 0.1–0.9)
Monocytes: 8 %
Neutrophils Absolute: 4 10*3/uL (ref 1.4–7.0)
Neutrophils: 55 %
RBC: 5.23 x10E6/uL (ref 3.77–5.28)
RDW: 12.1 % (ref 11.7–15.4)
WBC: 7.3 10*3/uL (ref 3.4–10.8)

## 2021-05-24 NOTE — Telephone Encounter (Signed)
-----   Message from Alfonse Spruce, MD sent at 05/24/2021  6:03 AM EDT ----- It looks like she has elevated eosinophils, at least elevated enough to get Dupixent approved.  She was highly motivated, so I am sure she will be on board with that.  Please remind her that I will help with her skin as well.  Malachi Bonds, MD [facility]

## 2021-05-24 NOTE — Telephone Encounter (Signed)
L/m for patient to reach out to me to discuss starting Dupixent for her asthma and skin

## 2021-05-25 ENCOUNTER — Other Ambulatory Visit: Payer: Self-pay

## 2021-05-26 NOTE — Telephone Encounter (Signed)
L/m for patient to contact me if interested in Dupixent

## 2021-05-26 NOTE — Telephone Encounter (Signed)
Noted. Thanks!   Oprah Camarena, MD [facility]  

## 2021-05-26 NOTE — Telephone Encounter (Signed)
Spoke to patient and she does want to move forward with Dupixent. I advised approval, submit to [employer], copay card to be emailed to her. I also advised her of delivery, storage and dosing instructions with initial loading dose to make appt to admin in clinic

## 2021-05-29 ENCOUNTER — Telehealth: Payer: Self-pay | Admitting: Allergy & Immunology

## 2021-05-29 NOTE — Telephone Encounter (Signed)
PA has been submitted through CoverMyMeds for Eucrisa and is currently pending approval/denial.  

## 2021-05-29 NOTE — Telephone Encounter (Signed)
[facility] states a PA has not been started for [place].  Please advise.

## 2021-05-30 NOTE — Telephone Encounter (Signed)
Pa has been approved pharmacy notified  

## 2021-06-06 ENCOUNTER — Other Ambulatory Visit: Payer: Self-pay | Admitting: Allergy & Immunology

## 2021-06-13 ENCOUNTER — Telehealth: Payer: Self-pay | Admitting: Allergy & Immunology

## 2021-06-13 NOTE — Telephone Encounter (Addendum)
Per Dr. Ellouise Newer last note, "We will refer you to dermatology for evaluation of the moles and other skin concerns". Patient called and states she never heard anything back. I have placed a referral with [facility] with Dr. Melida Quitter and faxed notes and demographics to [phone]. Patient informed of referral and practice information via voicemail.    Dx was sent as evaluation of moles and other skin concerns.

## 2021-06-19 ENCOUNTER — Other Ambulatory Visit: Payer: Self-pay | Admitting: *Deleted

## 2021-06-19 ENCOUNTER — Ambulatory Visit: Payer: BC Managed Care – PPO | Admitting: *Deleted

## 2021-06-19 ENCOUNTER — Other Ambulatory Visit: Payer: Self-pay

## 2021-06-19 DIAGNOSIS — J4540 Moderate persistent asthma, uncomplicated: Secondary | ICD-10-CM

## 2021-06-19 MED ORDER — DUPIXENT 300 MG/2ML ~~LOC~~ SOSY: 300.0000 mg | PREFILLED_SYRINGE | SUBCUTANEOUS | 11 refills | Status: DC

## 2021-06-19 NOTE — Progress Notes (Signed)
Immunotherapy   Patient Details  Name: [name] [name] MRN: [id number] Date of Birth: January 13, 1956  06/19/2021  [name] started injections for  Dupixent  Frequency: Every 2 Weeks  Epi-Pen: Not needed Consent signed and patient instructions given. Patient started Dupixent today and self administered 600mg . Patient waited 30 minutes and did not experience any issues. Patient will continue to self administer 300mg  Dupixent at home every 2 weeks.    [name] 06/19/2021, 2:46 PM

## 2021-06-20 NOTE — Telephone Encounter (Signed)
Patient schedule with Dr. Mickel Crow on 07/12/2021 at 8:45am.

## 2021-06-30 ENCOUNTER — Other Ambulatory Visit: Payer: Self-pay | Admitting: Allergy & Immunology

## 2021-10-14 IMAGING — MG DIGITAL SCREENING BILAT W/ TOMO W/ CAD
6 of 10 series · 6 of 30 positions shown · non-contrast
Comparison: Previous exam(s).

CLINICAL DATA: Screening.

EXAM:
DIGITAL SCREENING BILATERAL MAMMOGRAM WITH TOMO AND CAD

[R MLO synth-2D (1 of 2)]
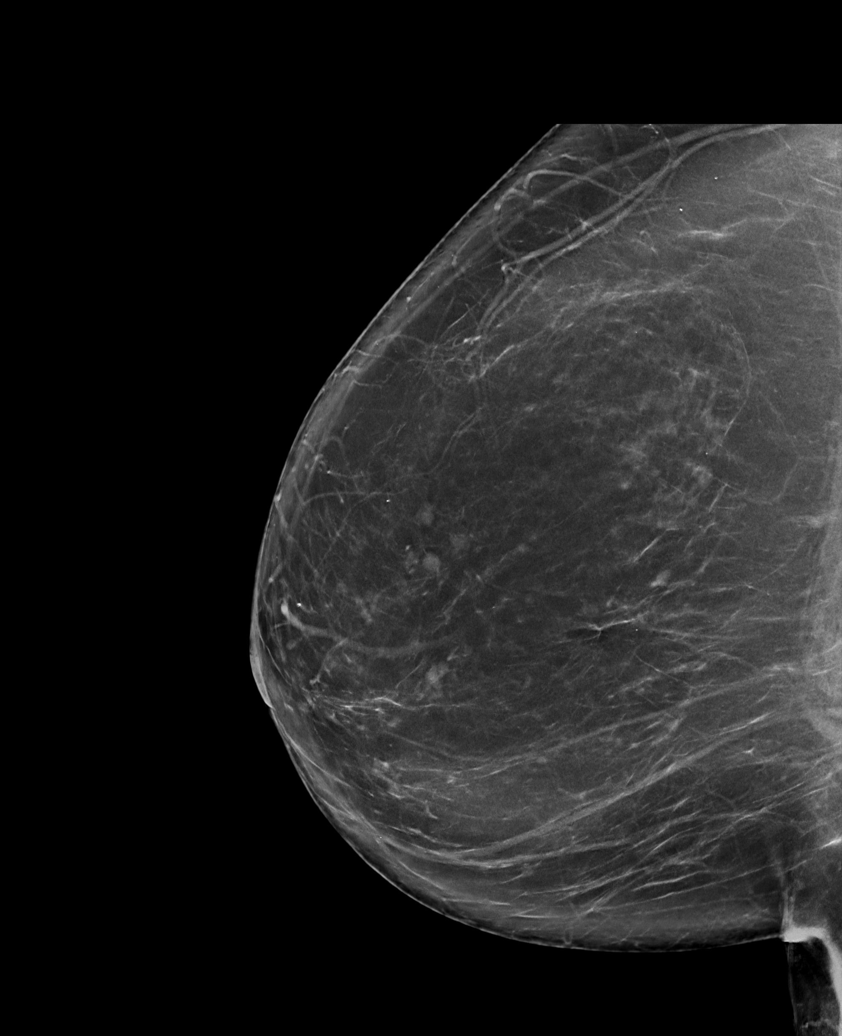

[L MLO synth-2D]
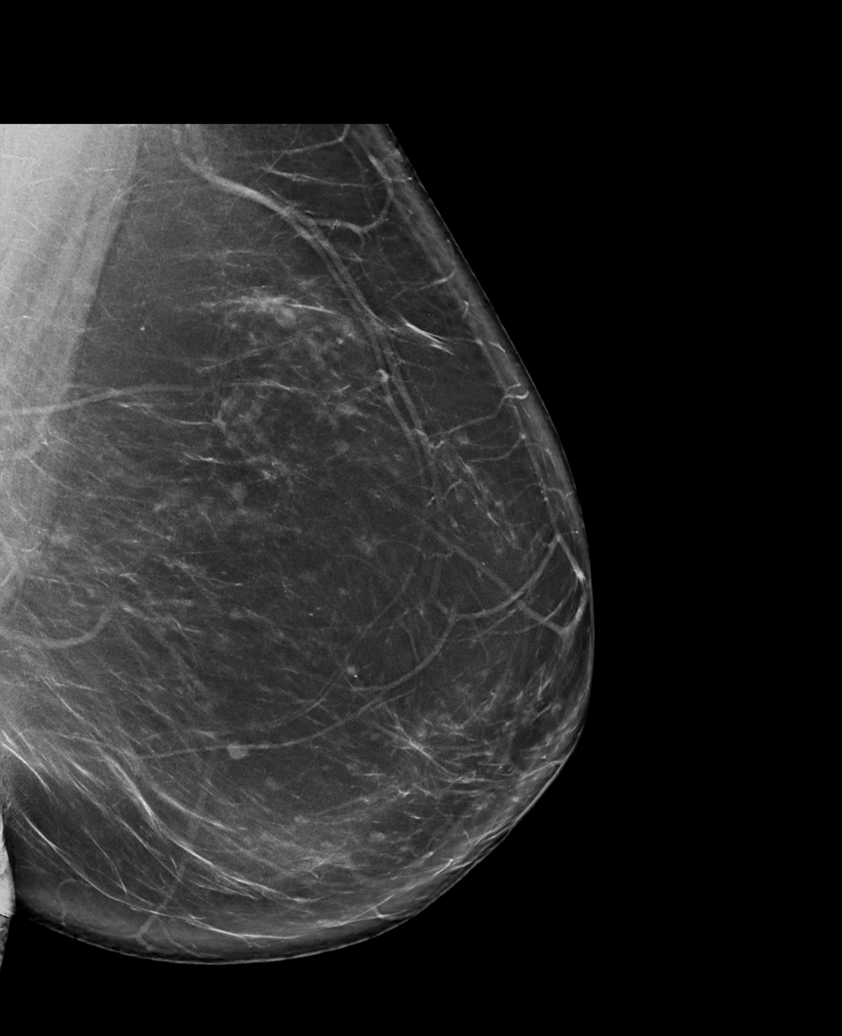

[R MLO synth-2D (2 of 2)]
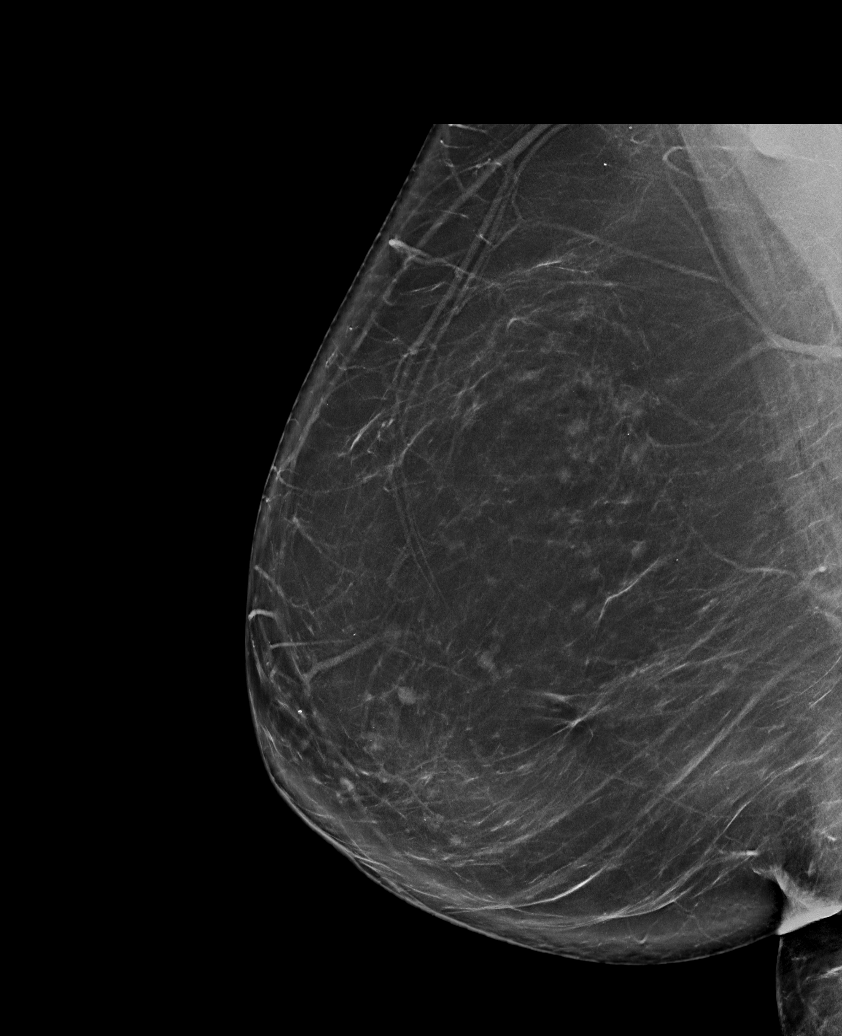

[R CC synth-2D]
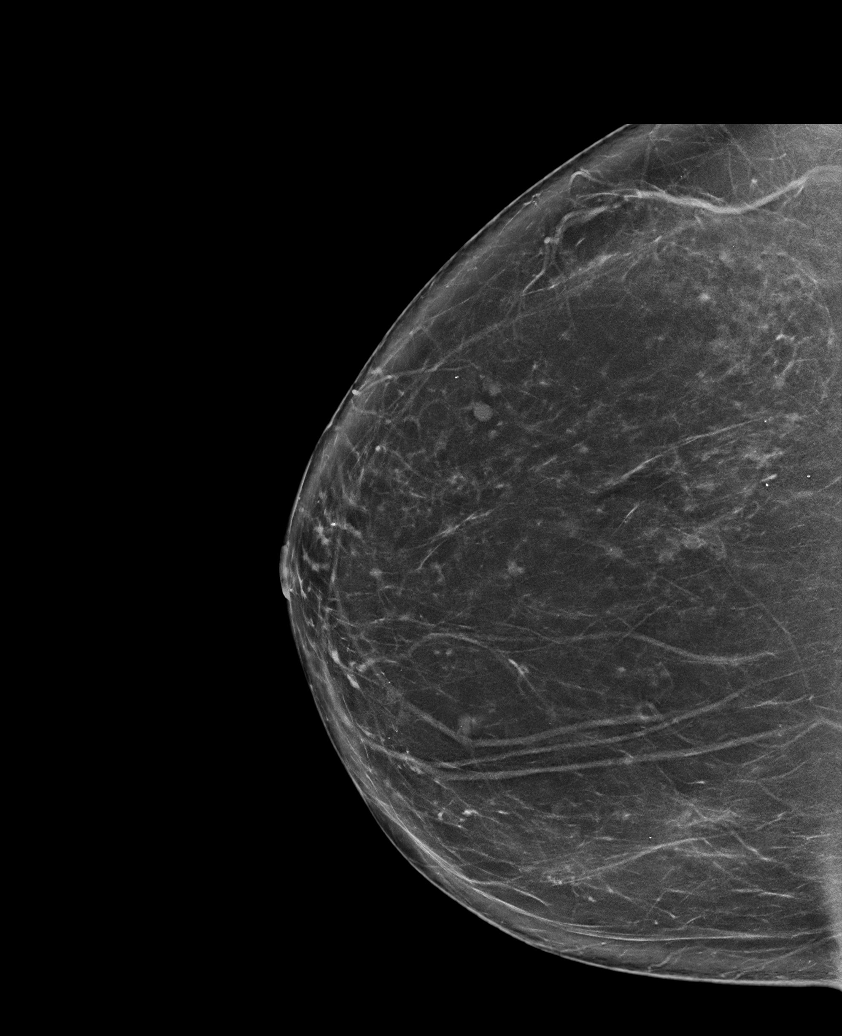

[L CC synth-2D]
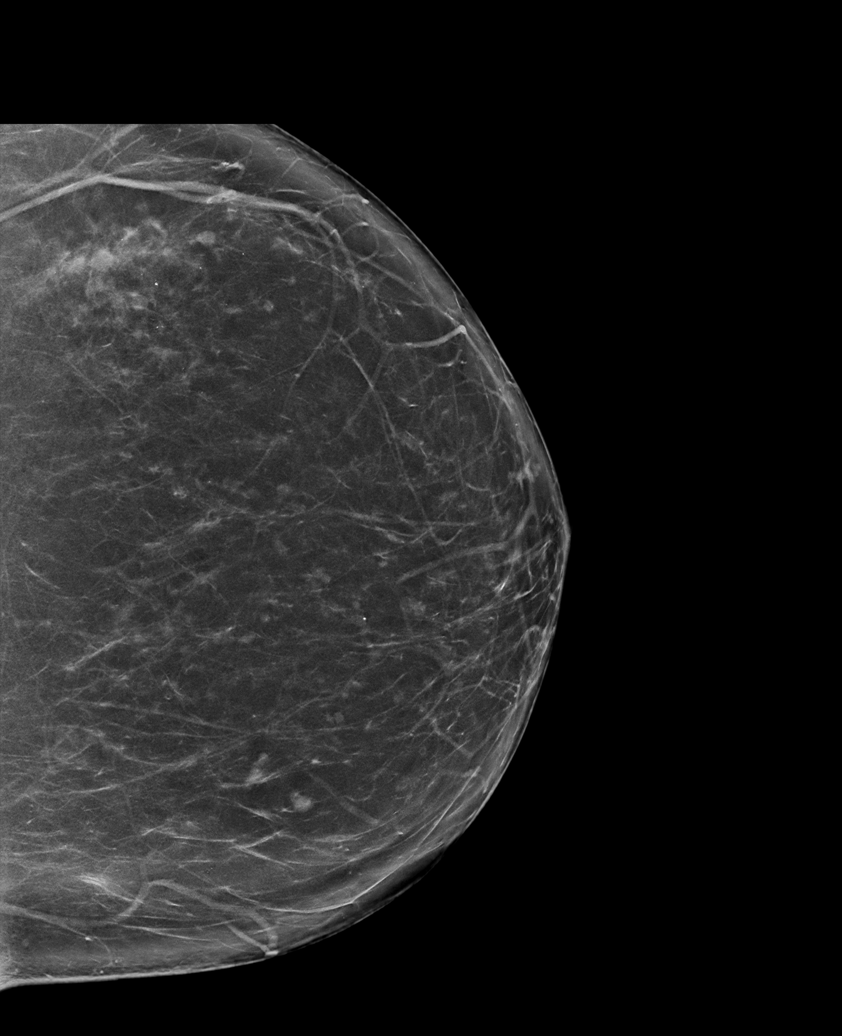

[R CC tomo · tomo slice 42/83.0]
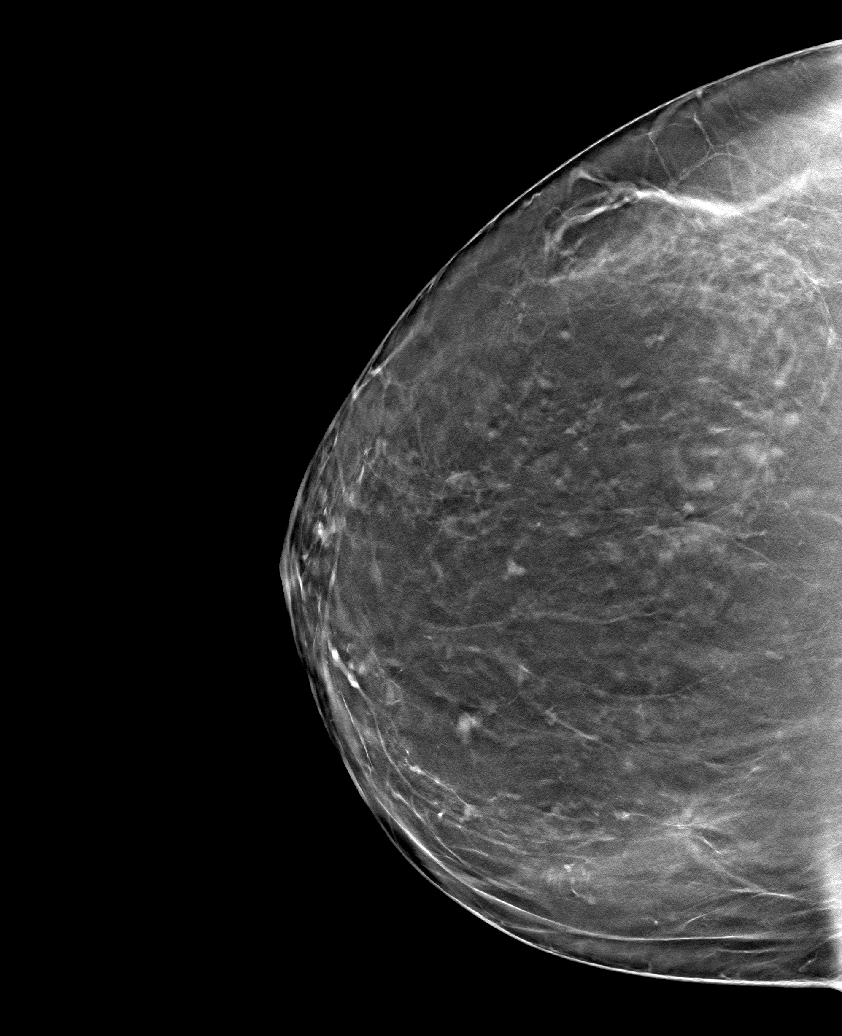

[6 of 30 positions shown; findings below may reference images not displayed]

ACR Breast Density Category b: There are scattered areas of
fibroglandular density.
FINDINGS: There are no findings suspicious for malignancy. Images were
processed with CAD.
IMPRESSION: No mammographic evidence of malignancy. A result letter of this
screening mammogram will be mailed directly to the patient.

RECOMMENDATION:
Screening mammogram in one year. (Code:[id number])

BI-RADS CATEGORY  1: Negative.

## 2021-11-23 ENCOUNTER — Ambulatory Visit: Payer: BC Managed Care – PPO | Admitting: Allergy & Immunology

## 2021-12-13 NOTE — Patient Instructions (Addendum)
1. Moderate persistent asthma, uncomplicated - Daily controller medication(s): Symbicort 80/4.38mcg ONE PUFF twice daily with spacer - Prior to physical activity: albuterol 2 puffs 10-15 minutes before physical activity. - Rescue medications: albuterol 4 puffs every 4-6 hours as needed - Changes during respiratory infections or worsening symptoms: Increase Symbicort to 2 puffs twice daily for ONE TO TWO WEEKS. - Asthma control goals:  * Full participation in all desired activities (may need albuterol before activity) * Albuterol use two time or less a week on average (not counting use with activity) * Cough interfering with sleep two time or less a month * Oral steroids no more than once a year * No hospitalizations  2. Seasonal and perennial allergic rhinitis - Continue with the as needed use of the Flonase and Astelin. - Continue with Singulair 10 mg nightly. - Continue with Zyrtec 10 mg nightly (can change to as needed to decrease some of the nasal dryness).  3. Flexural atopic dermatitis - Continue with Eucrisa twice daily as needed for flares (safe to use over the entire body). - Continue Dupixent injections every 2 weeks. This can also help your asthma -continue Protopic- using 1 application twice a day as needed to red itchy areas. This is not a steroid cream.  Please let us know if this treatment plan is not working well for you. Schedule a follow up appointment in 4-6 months

## 2021-12-14 ENCOUNTER — Telehealth: Payer: Self-pay | Admitting: *Deleted

## 2021-12-14 ENCOUNTER — Ambulatory Visit (INDEPENDENT_AMBULATORY_CARE_PROVIDER_SITE_OTHER): Payer: BC Managed Care – PPO | Admitting: Family

## 2021-12-14 ENCOUNTER — Other Ambulatory Visit: Payer: Self-pay

## 2021-12-14 ENCOUNTER — Encounter: Payer: Self-pay | Admitting: Family

## 2021-12-14 VITALS — BP 140/80 | HR 103 | Temp 97.9°F | Resp 20 | Ht 63.39 in | Wt 172.6 lb

## 2021-12-14 DIAGNOSIS — J4540 Moderate persistent asthma, uncomplicated: Secondary | ICD-10-CM

## 2021-12-14 DIAGNOSIS — L301 Dyshidrosis [pompholyx]: Secondary | ICD-10-CM | POA: Diagnosis not present

## 2021-12-14 DIAGNOSIS — J3089 Other allergic rhinitis: Secondary | ICD-10-CM | POA: Diagnosis not present

## 2021-12-14 DIAGNOSIS — J302 Other seasonal allergic rhinitis: Secondary | ICD-10-CM

## 2021-12-14 MED ORDER — EUCRISA 2 % EX OINT: TOPICAL_OINTMENT | CUTANEOUS | 2 refills | Status: DC

## 2021-12-14 MED ORDER — TACROLIMUS 0.03 % EX OINT: TOPICAL_OINTMENT | Freq: Two times a day (BID) | CUTANEOUS | 5 refills | Status: DC | PRN

## 2021-12-14 MED ORDER — BUDESONIDE-FORMOTEROL FUMARATE 80-4.5 MCG/ACT IN AERO: INHALATION_SPRAY | RESPIRATORY_TRACT | 5 refills | Status: DC

## 2021-12-14 MED ORDER — CETIRIZINE HCL 10 MG PO TABS: ORAL_TABLET | ORAL | 5 refills | Status: DC

## 2021-12-14 MED ORDER — MONTELUKAST SODIUM 10 MG PO TABS: 10.0000 mg | ORAL_TABLET | Freq: Every day | ORAL | 5 refills | Status: DC

## 2021-12-14 MED ORDER — ALBUTEROL SULFATE HFA 108 (90 BASE) MCG/ACT IN AERS: 2.0000 | INHALATION_SPRAY | RESPIRATORY_TRACT | 1 refills | Status: DC | PRN

## 2021-12-14 NOTE — Telephone Encounter (Signed)
PA has been submitted through CoverMyMeds for Eucrisa and is currently pending approval/denial.  

## 2021-12-14 NOTE — Progress Notes (Signed)
[address] Debbora Presto [name] [address] Dept: [phone]  FOLLOW UP NOTE  Patient ID: [name] [name], female    DOB: 1956/03/11  Age: 66 y.o. MRN: [id number] Date of Office Visit: 12/14/2021  Assessment  Chief Complaint: Asthma (ACT 23/Once in a while some coughing and stamina issues.), Allergic Rhinitis  ("Ok been using the eczema cream more than I would like."), and Other Dealer for shot per [facility] )  HPI Persephone [name] is a 66 year old female who presents today for follow-up of moderate persistent asthma, seasonal and perennial allergic rhinitis, and dyshidrotic eczema.  She was last seen on May 23, 2021 by Dr. Dellis Anes.  Since her last office visit she denies any new diagnosis or surgeries.  Moderate persistent asthma is reported as moderately controlled with Symbicort 80/4.5 mcg 2 puffs in the morning and albuterol as needed.  She reports a dry cough on occasion that is worse at night.  She does feel that her cough can be at times due to her dry throat or if she thinks about it.  She also reports shortness of breath with exertion.  She wonders if it is stamina.  She actually feels that her shortness of breath is better than what it has been.  She denies wheezing, tightness in her chest, and nocturnal awakenings due to breathing problems.  Since her last office visit she denies any systemic steroids and has not required any trips to the emergency room or urgent care due to breathing problems.  She reports seldom use of her albuterol inhaler and generally has to throw the albuterol inhaler away before she is used it that much.  Seasonal and perennial allergic rhinoconjunctivitis is reported as moderately controlled with Singulair 10 mg once a day, Zyrtec 10 mg once a day, Flonase nasal spray as needed, azelastine nasal spray as needed, and saline spray as needed.  She reports a little bit of rhinorrhea and some nasal congestion.  She denies having any  postnasal drip recently.  She has not had any sinus infections since we last saw her.  Dyshidrotic eczema is reported as doing better with Dupixent injections every 2 weeks and Eucrisa as needed.  She denies any reactions or problems with her Dupixent injections.  She also uses CeraVe a lotion for moisturization.  She mentions that she does not have any Dupixent injections available and that she is awaiting insurance approval.  Her next injection is due Monday.  She mentions that prior to starting Dupixent injections her hands and feet used to be really bad. She also mentions that she will at times get a rash on her chest and she will use Eucrisa and this clears it up.  She also will have itching on her back and she will put witches hazel on that to help.  Heartburn is reported as controlled with Nexium daily.  She reports that she very seldom has symptoms, but she did have an episode this week.   Drug Allergies:  Allergies  Allergen Reactions   Omeprazole Other (See Comments)   Zantac [Ranitidine] Other (See Comments)    Review of Systems: Review of Systems  Constitutional:  Negative for chills and fever.  HENT:         Reports nasal congestion, little rhinorrhea and denies recent post nasal drip  Eyes:        Reports painful eyes due to dry eyes. Denies itchy eyes  Respiratory:  Positive for shortness of breath. Negative for cough and wheezing.  Cardiovascular:  Negative for chest pain and palpitations.  Gastrointestinal:        Reports occasional heartburn  Genitourinary:  Negative for frequency.  Skin:  Positive for itching and rash.       Reports occasional itching/rash on chest and iching on back  Neurological:  Positive for headaches.       Reports occasional headaches  Endo/Heme/Allergies:  Positive for environmental allergies.    Physical Exam: BP 140/80    Pulse (!) 103    Temp 97.9 F (36.6 C) (Temporal)    Resp 20    Ht 5' 3.39" (1.61 m)    Wt 172 lb 9.6 oz (78.3 kg)     SpO2 95%    BMI 30.20 kg/m    Physical Exam Constitutional:      Appearance: Normal appearance.  HENT:     Head: Normocephalic and atraumatic.     Comments: Pharynx normal. Eyes normal. Ears normal. Nose normal    Right Ear: Tympanic membrane, ear canal and external ear normal.     Left Ear: Tympanic membrane, ear canal and external ear normal.     Nose: Nose normal.     Mouth/Throat:     Mouth: Mucous membranes are moist.     Pharynx: Oropharynx is clear.  Eyes:     Conjunctiva/sclera: Conjunctivae normal.  Cardiovascular:     Rate and Rhythm: Regular rhythm.     Heart sounds: Normal heart sounds.  Pulmonary:     Effort: Pulmonary effort is normal.     Breath sounds: Normal breath sounds.     Comments: Lungs clear to auscultation Musculoskeletal:     Cervical back: Neck supple.  Skin:    General: Skin is warm.     Comments: Slightly erythematous papules noted on chest. Dry, peeling skin noted on edge of right palm of hand. No bleeding or oozing noted.  Neurological:     Mental Status: She is alert and oriented to person, place, and time.  Psychiatric:        Mood and Affect: Mood normal.        Behavior: Behavior normal.        Thought Content: Thought content normal.        Judgment: Judgment normal.    Diagnostics: FVC 2.21 L, FEV1 2.19 L.  Predicted FVC 2.94 L, predicted FEV1 2.31 L spirometry indicates normal respiratory function.  Assessment and Plan: 1. Moderate persistent asthma without complication   2. Dyshidrotic eczema   3. Seasonal and perennial allergic rhinitis     No orders of the defined types were placed in this encounter.   Patient Instructions  1. Moderate persistent asthma, uncomplicated - Daily controller medication(s): Symbicort 80/4.64mcg ONE PUFF twice daily with spacer - Prior to physical activity: albuterol 2 puffs 10-15 minutes before physical activity. - Rescue medications: albuterol 4 puffs every 4-6 hours as needed - Changes  during respiratory infections or worsening symptoms: Increase Symbicort to 2 puffs twice daily for ONE TO TWO WEEKS. - Asthma control goals:  * Full participation in all desired activities (may need albuterol before activity) * Albuterol use two time or less a week on average (not counting use with activity) * Cough interfering with sleep two time or less a month * Oral steroids no more than once a year * No hospitalizations  2. Seasonal and perennial allergic rhinitis - Continue with the as needed use of the Flonase and Astelin. - Continue with Singulair 10 mg nightly. -  Continue with Zyrtec 10 mg nightly (can change to as needed to decrease some of the nasal dryness).  3. Flexural atopic dermatitis - Continue with Eucrisa twice daily as needed for flares (safe to use over the entire body). - Continue Dupixent injections every 2 weeks. This can also help your asthma -continue Protopic- using 1 application twice a day as needed to red itchy areas. This is not a steroid cream.  Please let us know if this treatment plan is not working well for you. Schedule a follow up appointment in 4-6 months      Return in about 6 months (around 06/13/2022), or if symptoms worsen or fail to improve.    Thank you for the opportunity to care for this patient.  Please do not hesitate to contact me with questions.  Nehemiah Settlehristine Cayman Kielbasa, FNP [facility]

## 2021-12-15 NOTE — Telephone Encounter (Signed)
PA is still pending.  

## 2021-12-18 NOTE — Telephone Encounter (Signed)
Insurance states that PA has been resolved and no further inquiries are needed.

## 2021-12-26 ENCOUNTER — Ambulatory Visit: Payer: BC Managed Care – PPO | Admitting: Allergy & Immunology

## 2022-01-17 ENCOUNTER — Other Ambulatory Visit: Payer: Self-pay | Admitting: Family Medicine

## 2022-01-17 DIAGNOSIS — M8580 Other specified disorders of bone density and structure, unspecified site: Secondary | ICD-10-CM

## 2022-01-19 IMAGING — CT CT ABD-PELV W/O CM
2 of 4 series · 16 of 46 positions shown, 18 images · non-contrast
Comparison: CT abdomen pelvis dated 01/31/2011.

CLINICAL DATA: 64-year-old female with left lower quadrant
abdominal pain. Concern for acute diverticulitis.

EXAM:
CT ABDOMEN AND PELVIS WITHOUT CONTRAST
TECHNIQUE: Multidetector CT imaging of the abdomen and pelvis was performed
following the standard protocol without IV contrast.

[Series 2: axial st · axial · 0.85mm/px · z∈[-404,+26]mm · 13 of 94 slices shown, 15 images]
[im 4/94  soft-tissue]
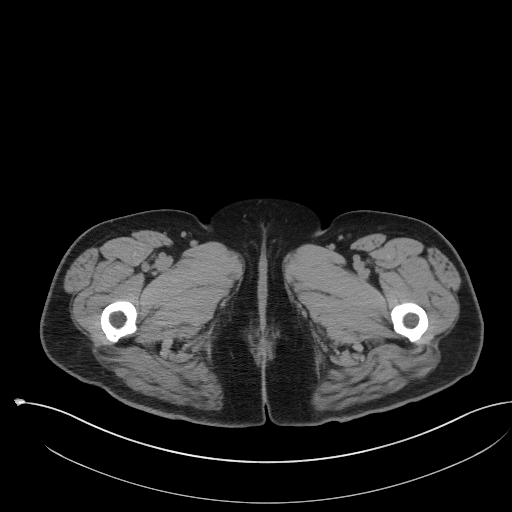
[im 4/94  bone]
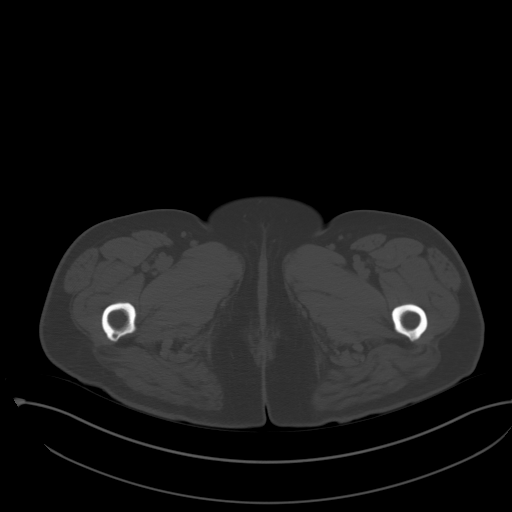
[im 12/94  soft-tissue]
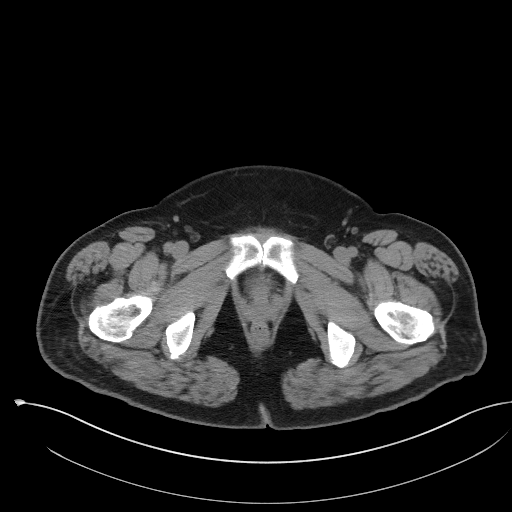
[im 19/94  soft-tissue]
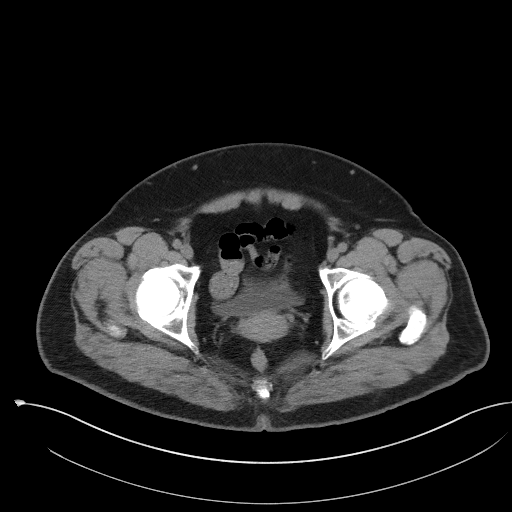
[im 27/94  soft-tissue]
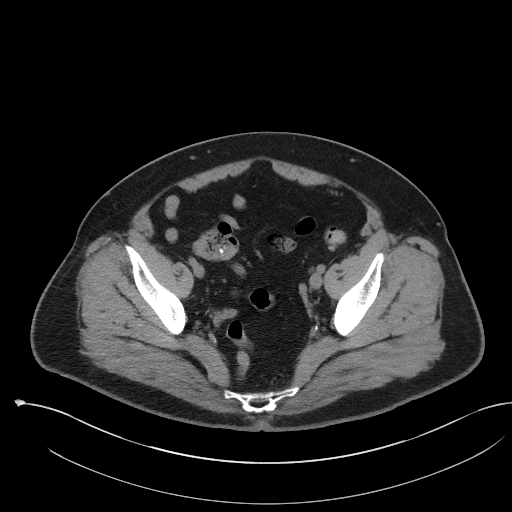
[im 34/94  soft-tissue]
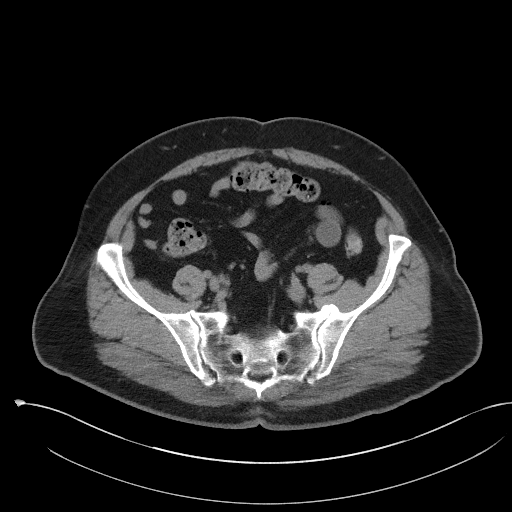
[im 41/94  soft-tissue]
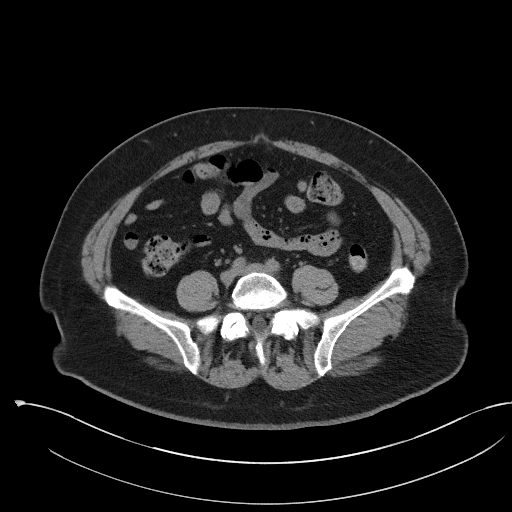
[im 49/94  soft-tissue]
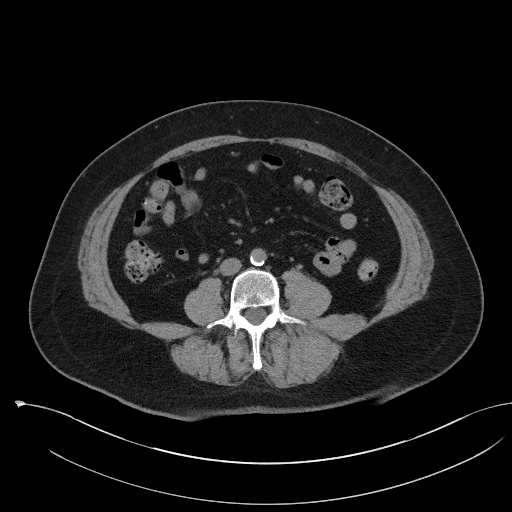
[im 53/94  soft-tissue]
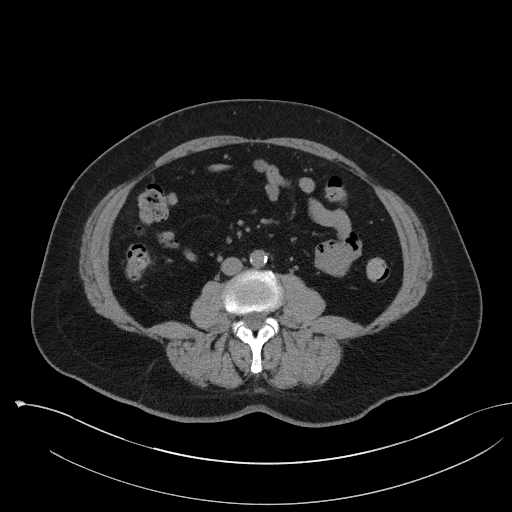
[im 60/94  soft-tissue]
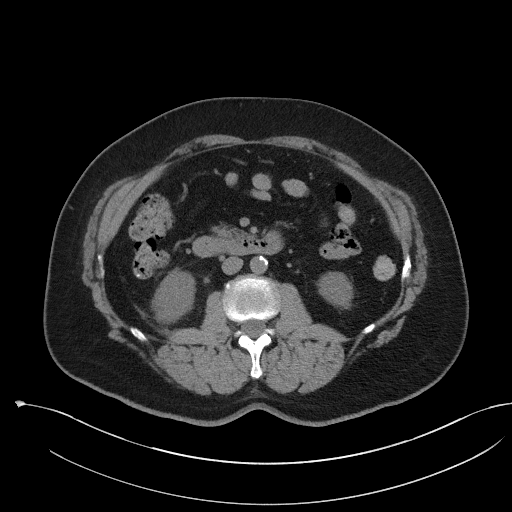
[im 60/94  bone]
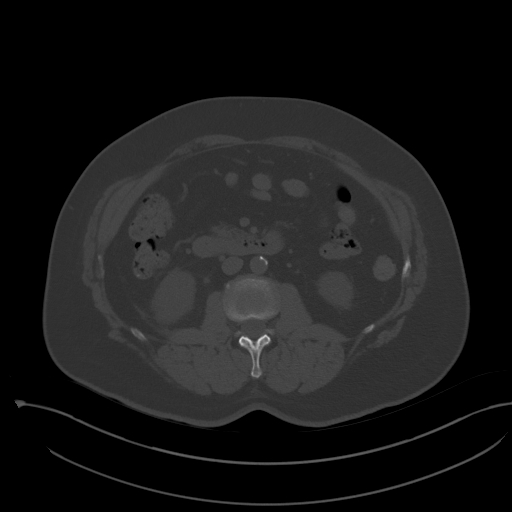
[im 67/94  soft-tissue]
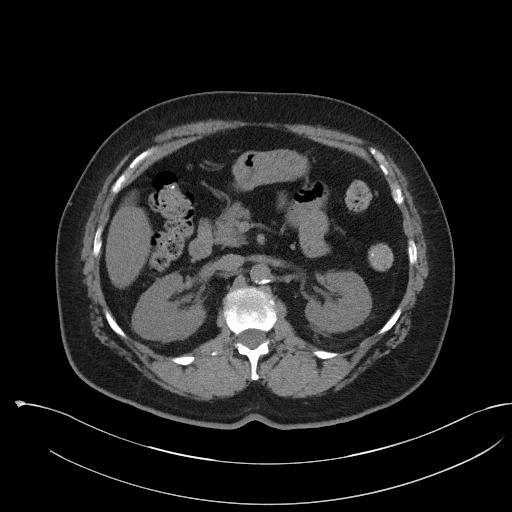
[im 75/94  soft-tissue]
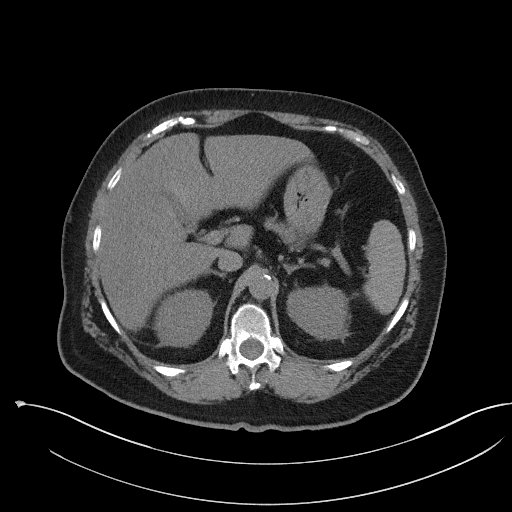
[im 82/94  soft-tissue]
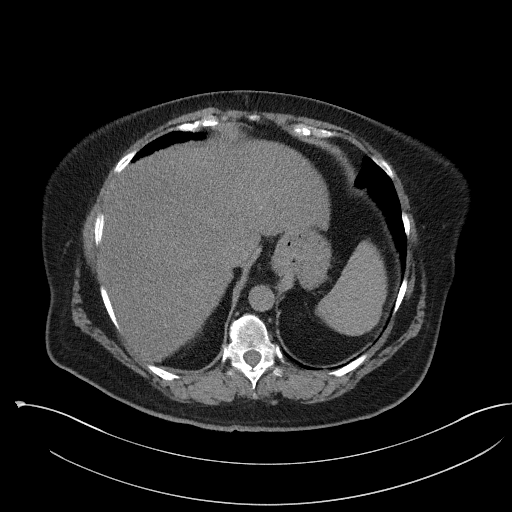
[im 90/94  soft-tissue]
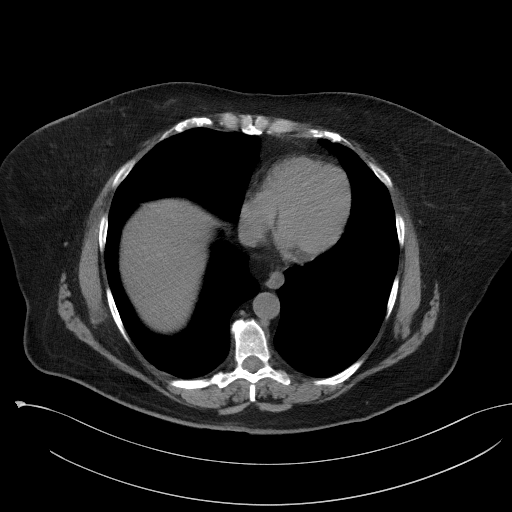

[Series 5: coronal st · coronal · 0.74mm/px · 3 of 100 slices shown]
[im 34/100  soft-tissue]
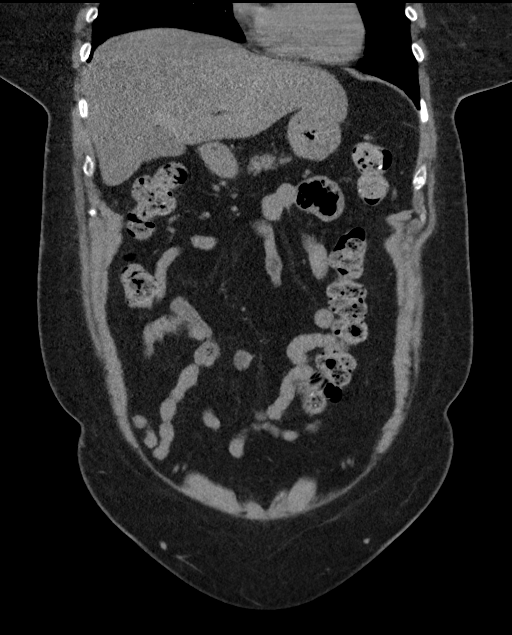
[im 45/100  soft-tissue]
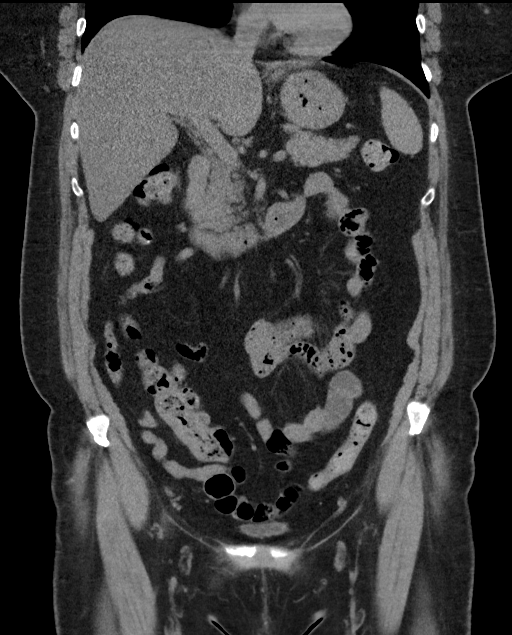
[im 56/100  soft-tissue]
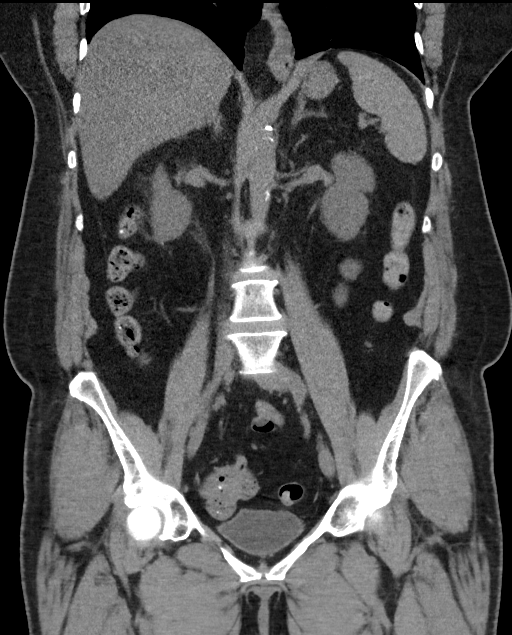

[16 of 46 positions shown; findings below may reference images not displayed]

FINDINGS: Evaluation of this exam is limited in the absence of intravenous
contrast.

Lower chest: The visualized lung bases are clear.

No intra-abdominal free air or free fluid.

Hepatobiliary: Diffuse fatty liver. No intrahepatic biliary
dilatation. The gallbladder is unremarkable.

Pancreas: Unremarkable. No pancreatic ductal dilatation or
surrounding inflammatory changes.

Spleen: Focal area of triangular hypodensity along the posterior
spleen consistent with an infarct ([DATE] and 66/5).

Adrenals/Urinary Tract: The adrenal glands unremarkable. The
kidneys, visualized ureters, and urinary bladder appear
unremarkable.

Stomach/Bowel: Small scattered sigmoid diverticula. Faint minimal
perisigmoid haziness (coronal 47/5), likely chronic. Mild early
diverticulitis is less likely but not excluded. Clinical correlation
is recommended. There is moderate stool throughout the colon. There
is no bowel obstruction. The appendix is normal.

Vascular/Lymphatic: Moderate aortoiliac atherosclerotic disease. The
IVC is unremarkable. No portal venous gas. There is no adenopathy.

Reproductive: The uterus and ovaries are grossly unremarkable. No
adnexal masses.

Other: None

Musculoskeletal: No acute or significant osseous findings.
IMPRESSION: 1. Small splenic infarct.
2. Small scattered sigmoid diverticula. Faint minimal perisigmoid
haziness, likely chronic. Mild early diverticulitis is less likely.
No bowel obstruction. Normal appendix.
3. Fatty liver.
4. Aortic Atherosclerosis ([id number]).

## 2022-03-30 ENCOUNTER — Other Ambulatory Visit: Payer: Self-pay | Admitting: Family Medicine

## 2022-03-30 DIAGNOSIS — Z1231 Encounter for screening mammogram for malignant neoplasm of breast: Secondary | ICD-10-CM

## 2022-04-05 ENCOUNTER — Other Ambulatory Visit: Payer: Self-pay | Admitting: Family

## 2022-04-05 ENCOUNTER — Other Ambulatory Visit: Payer: Self-pay | Admitting: Allergy

## 2022-04-10 ENCOUNTER — Ambulatory Visit
Admission: RE | Admit: 2022-04-10 | Discharge: 2022-04-10 | Disposition: A | Discharge status: Home / Self Care | Payer: BC Managed Care – PPO | Source: Ambulatory Visit | Attending: Family Medicine | Admitting: Family Medicine

## 2022-04-10 DIAGNOSIS — Z1231 Encounter for screening mammogram for malignant neoplasm of breast: Secondary | ICD-10-CM

## 2022-05-01 ENCOUNTER — Other Ambulatory Visit: Payer: Self-pay | Admitting: Family

## 2022-05-02 ENCOUNTER — Telehealth: Payer: Self-pay

## 2022-05-02 NOTE — Telephone Encounter (Signed)
Tamala/CSR from [facility] called in - DOB verified - to verify Patient was still using Eucrisa and our office would be using PA for patient - advised if patient's insurance requested.  [facility] Contact info:  Call: [phone]                                                                               Fax: [phone]  Lysle Dingwall verbalized understanding, no further questions.

## 2022-05-07 NOTE — Telephone Encounter (Signed)
Ok to send prescription with 1 refill.

## 2022-05-30 ENCOUNTER — Other Ambulatory Visit: Payer: Self-pay | Admitting: Family

## 2022-06-18 ENCOUNTER — Ambulatory Visit: Payer: BC Managed Care – PPO | Admitting: Family

## 2022-06-19 ENCOUNTER — Ambulatory Visit (INDEPENDENT_AMBULATORY_CARE_PROVIDER_SITE_OTHER): Payer: BC Managed Care – PPO | Admitting: Allergy & Immunology

## 2022-06-19 ENCOUNTER — Encounter: Payer: Self-pay | Admitting: Allergy & Immunology

## 2022-06-19 VITALS — BP 148/80 | HR 90 | Temp 98.2°F | Resp 16 | Wt 174.0 lb

## 2022-06-19 DIAGNOSIS — J3089 Other allergic rhinitis: Secondary | ICD-10-CM | POA: Diagnosis not present

## 2022-06-19 DIAGNOSIS — J4540 Moderate persistent asthma, uncomplicated: Secondary | ICD-10-CM

## 2022-06-19 DIAGNOSIS — L301 Dyshidrosis [pompholyx]: Secondary | ICD-10-CM

## 2022-06-19 DIAGNOSIS — J302 Other seasonal allergic rhinitis: Secondary | ICD-10-CM

## 2022-06-19 MED ORDER — FLUTICASONE FUROATE-VILANTEROL 100-25 MCG/ACT IN AEPB: 1.0000 | INHALATION_SPRAY | Freq: Every day | RESPIRATORY_TRACT | 5 refills | Status: AC

## 2022-06-19 NOTE — Progress Notes (Signed)
FOLLOW UP  Date of Service/Encounter:  06/19/22   Assessment:   Moderate persistent asthma, uncomplicated    Seasonal and perennial allergic rhinitis (grasses, weeds, ragweed, trees, molds, dust mite, cat, dog, mixed feathers, horse, cockroach)   Flexural atopic dermatitis   Plan/Recommendations:   1. Moderate persistent asthma, uncomplicated - Lung function looked pretty good today. - Stop the Symbicort temporarily and start Breo one puff once daily (contains a long acting albuterol with an inhaled steroid). - This is supposed to last for 24 hours.  - Daily controller medication(s): Breo 100/25 mcg one puff once daily  - Prior to physical activity: albuterol 2 puffs 10-15 minutes before physical activity. - Rescue medications: albuterol 4 puffs every 4-6 hours as needed - Asthma control goals:  * Full participation in all desired activities (may need albuterol before activity) * Albuterol use two time or less a week on average (not counting use with activity) * Cough interfering with sleep two time or less a month * Oral steroids no more than once a year * No hospitalizations  2. Seasonal and perennial allergic rhinitis - Continue with the as needed use of the Flonase and Astelin. - Continue with Singulair (montelukast) 10mg  daily (you can try stopping this one AFTER you have tried the Breo change).  - Continue with Zyrtec 10 mg nightly (can change to as needed to decrease some of the nasal dryness). - Allergen immunotherapy could be a consideration.   3. Flexural atopic dermatitis - Continue with Eucrisa twice daily as needed for flares (safe to use over the entire body). - Continue with tacrolimus twice daily as needed for flares (safe to use over the entire body).  - Continue with Dupixent for control of your eczema.  4. Return in about 6 months (around 12/20/2022).    Subjective:   [name] [name] is a 66 y.o. female presenting today for follow up of  Chief  Complaint  Patient presents with   Asthma    Fair. 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  [name] is a 66 y.o. female presenting for a follow up visit.  She was last seen in January 2023.  At that time, she was continued on Symbicort 80 mcg 1 puff twice daily as well as albuterol as needed.  For the allergic rhinitis, we continue with Singulair as well as Zyrtec.  Atopic dermatitis was controlled with Eucrisa as well as Protopic and Dupixent every 2 weeks which she administers at home.  Since last visit, she has done mostly well.   Asthma/Respiratory Symptom History: Breathing is going fairly well. She does not like to use medications if she does not have to. She has been using the Symbicort twice daily for a week or two.  She normally does it once a daily. She has never been able to use it as needed. She has not needed prednisone and she has not  been to the ED or Urgent Care for her symptoms at all. She feels that her symptoms are under fairly good control.   Allergic  Rhinitis Symptom History: She does not use hte nose sprays often at all. She uses mostly  water. She has used the [employer] pot. She remains on the montelukast at night. This all seems to be working fairly well. She prefers to not use any kind of medications at all. She has not been on antibiotics at all since the last visit.   GERD Symptom History: She normally does it to get good sleep at night. Symptoms are worse at night. She has a blanket at  the end of the bed to elevate. She is good about avoiding foods before going to bed. She does take Nexium once daily.    Otherwise, there have been no changes to her past medical history, surgical history, family history, or social history.    Review of Systems  Constitutional:  Negative for chills and fever.  HENT:         Reports nasal congestion, little rhinorrhea and denies recent post nasal drip  Eyes:        Reports painful eyes due to dry eyes. Denies itchy eyes  Respiratory:  Positive for shortness of breath. Negative for cough and wheezing.   Cardiovascular:  Negative for chest pain and palpitations.  Gastrointestinal:        Reports occasional heartburn  Genitourinary:  Negative for frequency.  Skin:  Negative for itching and rash.       Reports occasional itching/rash on chest and iching on back  Neurological:  Positive for headaches.       Reports occasional headaches  Endo/Heme/Allergies:  Positive for environmental allergies. Does not bruise/bleed easily.       Objective:   Blood pressure (!) 148/80, pulse 90, temperature 98.2 F (36.8 C), temperature source Temporal, resp. rate 16, weight 174 lb (78.9 kg), SpO2 92 %. Body mass index is 30.45 kg/m.    Physical Exam Vitals reviewed.  Constitutional:      Appearance: She is well-developed.  HENT:     Head: Normocephalic and atraumatic.     Comments: Somewhat quiet today, but interactive in the end.     Right Ear: Tympanic membrane, ear canal and external ear normal.     Left Ear: Tympanic membrane, ear canal and external ear normal.     Nose: No nasal deformity, septal deviation, mucosal edema or rhinorrhea.     Right Turbinates: Not enlarged or swollen.     Left Turbinates: Not enlarged or swollen.     Right Sinus: No maxillary sinus tenderness or frontal sinus tenderness.     Left Sinus: No maxillary sinus tenderness or frontal sinus tenderness.     Mouth/Throat:     Mouth: Mucous membranes are not pale  and not dry.     Pharynx: Uvula midline.  Eyes:     General: Lids are normal. No allergic shiner.       Right eye: No discharge.        Left eye: No discharge.     Conjunctiva/sclera: Conjunctivae normal.     Right eye: Right conjunctiva is not injected. No chemosis.    Left eye: Left conjunctiva is not injected. No chemosis.    Pupils: Pupils are equal, round, and reactive to light.  Cardiovascular:     Rate and Rhythm: Normal rate and regular rhythm.     Heart sounds: Normal heart sounds.  Pulmonary:     Effort: Pulmonary effort is normal. No tachypnea, accessory muscle usage or respiratory distress.     Breath sounds: Normal  breath sounds. No wheezing, rhonchi or rales.  Chest:     Chest wall: No tenderness.  Lymphadenopathy:     Cervical: No cervical adenopathy.  Skin:    Coloration: Skin is not pale.     Findings: No abrasion, erythema, petechiae or rash. Rash is not papular, urticarial or vesicular.  Neurological:     Mental Status: She is alert.  Psychiatric:        Behavior: Behavior is cooperative.      Diagnostic studies:    Spirometry: results normal (FEV1: 2.17/92%, FVC: 2.49/81%, FEV1/FVC: 87%).    Spirometry consistent with normal pattern.   Allergy Studies: none       Malachi Bonds, MD  [facility]

## 2022-06-19 NOTE — Patient Instructions (Addendum)
1. Moderate persistent asthma, uncomplicated - Lung function looked pretty good today. - Stop the Symbicort temporarily and start Breo one puff once daily (contains a long acting albuterol with an inhaled steroid). - This is supposed to last for 24 hours.  - Daily controller medication(s): Breo 100/25 mcg one puff once daily  - Prior to physical activity: albuterol 2 puffs 10-15 minutes before physical activity. - Rescue medications: albuterol 4 puffs every 4-6 hours as needed - Asthma control goals:  * Full participation in all desired activities (may need albuterol before activity) * Albuterol use two time or less a week on average (not counting use with activity) * Cough interfering with sleep two time or less a month * Oral steroids no more than once a year * No hospitalizations  2. Seasonal and perennial allergic rhinitis - Continue with the as needed use of the Flonase and Astelin. - Continue with Singulair (montelukast) 10mg  daily (you can try stopping this one AFTER you have tried the Breo change).  - Continue with Zyrtec 10 mg nightly (can change to as needed to decrease some of the nasal dryness).  3. Flexural atopic dermatitis - Continue with Eucrisa twice daily as needed for flares (safe to use over the entire body). - Continue with tacrolimus twice daily as needed for flares (safe to use over the entire body).  - Continue with Dupixent for control of your eczema.  4. Return in about 6 months (around 12/20/2022).    Please inform 12/22/2022 of any Emergency Department visits, hospitalizations, or changes in symptoms. Call us before going to the ED for breathing or allergy symptoms since we might be able to fit you in for a sick visit. Feel free to contact us anytime with any questions, problems, or concerns.  It was a pleasure to see you today!  Websites that have reliable patient information: 1. American Academy of Asthma, Allergy, and Immunology: www.aaaai.org 2. Food Allergy  Research and Education (FARE): foodallergy.org 3. Mothers of Asthmatics: http://www.asthmacommunitynetwork.org 4. American College of Allergy, Asthma, and Immunology: www.acaai.org   COVID-19 Vaccine Information can be found at: [place] For questions related to vaccine distribution or appointments, please email [email or web address] or call [phone].   We realize that you might be concerned about having an allergic reaction to the COVID19 vaccines. To help with that concern, WE ARE OFFERING THE COVID19 VACCINES IN OUR OFFICE! Ask the front desk for dates!     "Like" [phone] on Facebook and Instagram for our latest updates!      A healthy democracy works best when [place] participate! Make sure you are registered to vote! If you have moved or changed any of your contact information, you will need to get this updated before voting!  In some cases, you MAY be able to register to vote online: [employer]

## 2022-06-20 ENCOUNTER — Other Ambulatory Visit: Payer: Self-pay | Admitting: *Deleted

## 2022-06-20 MED ORDER — DUPIXENT 300 MG/2ML ~~LOC~~ SOSY
300.0000 mg | PREFILLED_SYRINGE | SUBCUTANEOUS | 11 refills | Status: DC
Start: 2022-06-20 — End: 2022-11-01

## 2022-07-30 ENCOUNTER — Ambulatory Visit
Admission: RE | Admit: 2022-07-30 | Discharge: 2022-07-30 | Disposition: A | Discharge status: Home / Self Care | Payer: BC Managed Care – PPO | Source: Ambulatory Visit | Attending: Family Medicine | Admitting: Family Medicine

## 2022-07-30 DIAGNOSIS — M8580 Other specified disorders of bone density and structure, unspecified site: Secondary | ICD-10-CM

## 2022-11-01 ENCOUNTER — Other Ambulatory Visit: Payer: Self-pay | Admitting: *Deleted

## 2022-11-01 MED ORDER — DUPIXENT 300 MG/2ML ~~LOC~~ SOSY
300.0000 mg | PREFILLED_SYRINGE | SUBCUTANEOUS | 11 refills | Status: DC
Start: 2022-11-01 — End: 2023-06-20

## 2022-11-11 ENCOUNTER — Other Ambulatory Visit: Payer: Self-pay | Admitting: Family

## 2022-12-20 ENCOUNTER — Ambulatory Visit (INDEPENDENT_AMBULATORY_CARE_PROVIDER_SITE_OTHER): Payer: BC Managed Care – PPO | Admitting: Allergy & Immunology

## 2022-12-20 ENCOUNTER — Other Ambulatory Visit: Payer: Self-pay

## 2022-12-20 ENCOUNTER — Encounter: Payer: Self-pay | Admitting: Allergy & Immunology

## 2022-12-20 VITALS — BP 140/80 | HR 100 | Temp 98.2°F | Resp 16 | Wt 174.7 lb

## 2022-12-20 DIAGNOSIS — L301 Dyshidrosis [pompholyx]: Secondary | ICD-10-CM | POA: Diagnosis not present

## 2022-12-20 DIAGNOSIS — J302 Other seasonal allergic rhinitis: Secondary | ICD-10-CM

## 2022-12-20 DIAGNOSIS — J4540 Moderate persistent asthma, uncomplicated: Secondary | ICD-10-CM | POA: Diagnosis not present

## 2022-12-20 DIAGNOSIS — J3089 Other allergic rhinitis: Secondary | ICD-10-CM | POA: Diagnosis not present

## 2022-12-20 MED ORDER — FLUTICASONE FUROATE-VILANTEROL 100-25 MCG/ACT IN AEPB: 1.0000 | INHALATION_SPRAY | Freq: Every day | RESPIRATORY_TRACT | 5 refills | Status: DC

## 2022-12-20 NOTE — Patient Instructions (Addendum)
1. Moderate persistent asthma, uncomplicated - Lung function looked pretty good today. - We are just going to continue with the Breo since you like the daily dosing. - We will change once you retire to see what is more affordable. - We gave you a sample of Trelegy (contains Breo plus another medication). - Daily controller medication(s): Breo 100/25 mcg one puff once daily  - Prior to physical activity: albuterol 2 puffs 10-15 minutes before physical activity. - Rescue medications: albuterol 4 puffs every 4-6 hours as needed - Asthma control goals:  * Full participation in all desired activities (may need albuterol before activity) * Albuterol use two time or less a week on average (not counting use with activity) * Cough interfering with sleep two time or less a month * Oral steroids no more than once a year * No hospitalizations  2. Seasonal and perennial allergic rhinitis - Continue with the as needed use of the Flonase and Astelin. - Continue with Zyrtec 10 mg nightly daily.   3. Flexural atopic dermatitis - Continue with Eucrisa twice daily as needed for flares (safe to use over the entire body). - Continue with tacrolimus twice daily as needed for flares (safe to use over the entire body).  - Continue with Dupixent for control of your eczema.  4. Return in about 6 months (around 06/20/2023).    Please inform us of any Emergency Department visits, hospitalizations, or changes in symptoms. Call us before going to the ED for breathing or allergy symptoms since we might be able to fit you in for a sick visit. Feel free to contact us anytime with any questions, problems, or concerns.  It was a pleasure to see you today! CONGRATS on the retirement!!   Websites that have reliable patient information: 1. American Academy of Asthma, Allergy, and Immunology: www.aaaai.org 2. Food Allergy Research and Education (FARE): foodallergy.org 3. Mothers of Asthmatics:  http://www.asthmacommunitynetwork.org 4. American College of Allergy, Asthma, and Immunology: www.acaai.org   COVID-19 Vaccine Information can be found at: ShippingSc[employer].co.uk For questions related to vaccine distribution or appointments, please email [email or web address] or call [phone].   We realize that you might be concerned about having an allergic reaction to the COVID19 vaccines. To help with that concern, WE ARE OFFERING THE COVID19 VACCINES IN OUR OFFICE! Ask the front desk for dates!     "Like" [place] on Facebook and Instagram for our latest updates!      A healthy democracy works best when [employer] participate! Make sure you are registered to vote! If you have moved or changed any of your contact information, you will need to get this updated before voting!  In some cases, you MAY be able to register to vote online: [email or web address]

## 2022-12-20 NOTE — Progress Notes (Signed)
FOLLOW UP  Date of Service/Encounter:  12/20/22   Assessment:   Moderate persistent asthma, uncomplicated - doing well on Breo   Seasonal and perennial allergic rhinitis (grasses, weeds, ragweed, trees, molds, dust mite, cat, dog, mixed feathers, horse, cockroach)   Flexural atopic dermatitis - doing well on Dupixent   Plan/Recommendations:   1. Moderate persistent asthma, uncomplicated - Lung function looked pretty good today. - We are just going to continue with the Breo since you like the daily dosing. - We will change once you retire to see what is more affordable. - We gave you a sample of Trelegy (contains Breo plus another medication). - Daily controller medication(s): Breo 100/25 mcg one puff once daily  - Prior to physical activity: albuterol 2 puffs 10-15 minutes before physical activity. - Rescue medications: albuterol 4 puffs every 4-6 hours as needed - Asthma control goals:  * Full participation in all desired activities (may need albuterol before activity) * Albuterol use two time or less a week on average (not counting use with activity) * Cough interfering with sleep two time or less a month * Oral steroids no more than once a year * No hospitalizations  2. Seasonal and perennial allergic rhinitis - Continue with the as needed use of the Flonase and Astelin. - Continue with Zyrtec 10 mg nightly daily.   3. Flexural atopic dermatitis - Continue with Eucrisa twice daily as needed for flares (safe to use over the entire body). - Continue with tacrolimus twice daily as needed for flares (safe to use over the entire body).  - Continue with Dupixent for control of your eczema.  4. Return in about 6 months (around 06/20/2023).   Subjective:   [name] [name] is a 67 y.o. female presenting today for follow up of  Chief Complaint  Patient presents with   Follow-up    Ruff of mouth is raw. Had root canal done last week. Suspects that it might be thrush again.     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  [name] [name] is a 67 y.o. female presenting for a follow up visit.  She was last seen in July 2023.  At that time, her lung function looks stable.  We stopped Symbicort and started Breo 1 puff once daily.  We continue with albuterol as needed.  For her allergic rhinitis, we continued with Singulair as well as Zyrtec.  For her atopic dermatitis, we continue with Eucrisa and Protopic as well as Dupixent.  Since last visit, she has done very well.   She reports that she has some issues on the top of her mouth with some roughness. She had a root canal last week and her mouth has been super sore since that time. She does not think that she has thrush. She does rinse her mouth and spit each time.  Asthma/Respiratory Symptom History: She remains on the [facility]. She likes that this is once daily. She pays $30 per month for this. This is much more than the Symbicort. She is retiring at the end of June.  She is  already getting retirement from [place] where she worked for 15 years. She is going to have 17 years here with [employer]. She is fine with paying the higher copay to only take her medication once a day.   Allergic Rhinitis Symptom History: She did not stop the Singulair and seemed to do fine. She is still on the cetirizine which she uses daily.  She is willing to stop the Singulair to see how she does with that. She has not been on antibiotics at all for her symptoms. She is overall  doing well.   Skin Symptom History: She is doing well with the [place]. She cannot rmeember when she is supposed to take it.  So sometimes it goes three weeks before she gets her next dose. She typically injects into the stomach. She denies any side effects. She has not been using her topical medications much at all and hence does not need refills.   She works at [employer].  She will be retiring in June.  She is not sure what she is going to do after retirement, but she is definitely on the takes time off.  She might work something part-time 3 to 4 days a week just to get her out of the house and keep her mind sharp. She is going to visit family in [place] after retirement.   Otherwise, there have been no changes to her past medical history, surgical history, family history, or social history.    Review of Systems  Constitutional:  Negative for chills and fever.  HENT:         Reports nasal congestion, little rhinorrhea and denies recent post nasal drip  Eyes:        Reports painful eyes due to dry eyes. Denies itchy eyes  Respiratory:  Negative for cough, shortness of breath and wheezing.   Cardiovascular:  Negative for chest pain and palpitations.  Gastrointestinal:        Reports occasional heartburn  Genitourinary:  Negative for frequency.  Skin:  Negative for itching and rash.       Reports occasional itching/rash on chest and iching on back  Neurological:  Negative for headaches.       Reports occasional headaches  Endo/Heme/Allergies:  Negative for environmental allergies. Does not bruise/bleed easily.       Objective:   Blood pressure (!) 140/80, pulse 100, temperature 98.2 F (36.8 C), temperature source Oral, resp. rate 16, weight 174 lb 11.2 oz (79.2 kg), SpO2 95 %. Body mass index is 30.57 kg/m.    Physical Exam Vitals reviewed.  Constitutional:      Appearance: She is well-developed.     Comments: Delightful.   HENT:     Head: Normocephalic and  atraumatic.     Comments: Somewhat quiet today, but interactive in the end.     Right Ear: Tympanic membrane, ear canal and external ear normal.     Left Ear: Tympanic membrane, ear canal and external ear normal.     Nose: No nasal deformity, septal deviation, mucosal edema or rhinorrhea.     Right Turbinates: Enlarged and pale. Not swollen.     Left Turbinates: Enlarged and pale. Not swollen.     Right Sinus: No maxillary sinus tenderness or frontal sinus tenderness.     Left Sinus: No maxillary sinus tenderness or frontal sinus tenderness.     Mouth/Throat:     Mouth: Mucous membranes are not pale and not dry.  Pharynx: Uvula midline.  Eyes:     General: Lids are normal. No allergic shiner.       Right eye: No discharge.        Left eye: No discharge.     Conjunctiva/sclera: Conjunctivae normal.     Right eye: Right conjunctiva is not injected. No chemosis.    Left eye: Left conjunctiva is not injected. No chemosis.    Pupils: Pupils are equal, round, and reactive to light.  Cardiovascular:     Rate and Rhythm: Normal rate and regular rhythm.     Heart sounds: Normal heart sounds.  Pulmonary:     Effort: Pulmonary effort is normal. No tachypnea, accessory muscle usage or respiratory distress.     Breath sounds: Normal breath sounds. No wheezing, rhonchi or rales.  Chest:     Chest wall: No tenderness.  Lymphadenopathy:     Cervical: No cervical adenopathy.  Skin:    Coloration: Skin is not pale.     Findings: No abrasion, erythema, petechiae or rash. Rash is not papular, urticarial or vesicular.  Neurological:     Mental Status: She is alert.  Psychiatric:        Behavior: Behavior is cooperative.      Diagnostic studies:    Spirometry: results normal (FEV1: 2.56/112%, FVC: 2.90/100%, FEV1/FVC: 88%).    Spirometry consistent with normal pattern.     Allergy Studies: none        Malachi Bonds, MD  [facility]

## 2023-06-10 ENCOUNTER — Ambulatory Visit (INDEPENDENT_AMBULATORY_CARE_PROVIDER_SITE_OTHER): Payer: Medicare Other | Admitting: Family Medicine

## 2023-06-10 ENCOUNTER — Encounter: Payer: Self-pay | Admitting: Family Medicine

## 2023-06-10 ENCOUNTER — Other Ambulatory Visit: Payer: Self-pay

## 2023-06-10 VITALS — BP 154/80 | HR 105 | Temp 98.2°F | Resp 20 | Wt 183.0 lb

## 2023-06-10 DIAGNOSIS — J3089 Other allergic rhinitis: Secondary | ICD-10-CM | POA: Diagnosis not present

## 2023-06-10 DIAGNOSIS — J4540 Moderate persistent asthma, uncomplicated: Secondary | ICD-10-CM

## 2023-06-10 DIAGNOSIS — L301 Dyshidrosis [pompholyx]: Secondary | ICD-10-CM

## 2023-06-10 DIAGNOSIS — K219 Gastro-esophageal reflux disease without esophagitis: Secondary | ICD-10-CM | POA: Diagnosis not present

## 2023-06-10 DIAGNOSIS — J302 Other seasonal allergic rhinitis: Secondary | ICD-10-CM | POA: Insufficient documentation

## 2023-06-10 MED ORDER — LEVOCETIRIZINE DIHYDROCHLORIDE 5 MG PO TABS: 5.0000 mg | ORAL_TABLET | Freq: Every evening | ORAL | 5 refills | Status: DC

## 2023-06-10 MED ORDER — TRELEGY ELLIPTA 200-62.5-25 MCG/ACT IN AEPB: 1.0000 | INHALATION_SPRAY | Freq: Every day | RESPIRATORY_TRACT | 5 refills | Status: DC

## 2023-06-10 MED ORDER — TACROLIMUS 0.03 % EX OINT: TOPICAL_OINTMENT | Freq: Two times a day (BID) | CUTANEOUS | 5 refills | Status: AC | PRN

## 2023-06-10 MED ORDER — EUCRISA 2 % EX OINT: TOPICAL_OINTMENT | CUTANEOUS | 2 refills | Status: AC

## 2023-06-10 MED ORDER — ALBUTEROL SULFATE HFA 108 (90 BASE) MCG/ACT IN AERS: 2.0000 | INHALATION_SPRAY | RESPIRATORY_TRACT | 0 refills | Status: DC | PRN

## 2023-06-10 MED ORDER — FLUTICASONE PROPIONATE 50 MCG/ACT NA SUSP: 2.0000 | Freq: Every day | NASAL | 5 refills | Status: DC

## 2023-06-10 NOTE — Progress Notes (Addendum)
[address] [place] [address] Dept: [phone]  FOLLOW UP NOTE  Patient ID: [name] [name], female    DOB: 1956/03/15  Age: 67 y.o. MRN: [id number] Date of Office Visit: 06/10/2023  Assessment  Chief Complaint: Follow-up (Persistent dry cough. For months now. Dry nose. Spitting up phlegm in the morning yellow in color.)  HPI [name] [name] is a 67 year old female who presents to the clinic for follow-up visit.  She was last seen in this clinic on 12/20/2022 by Dr. Dellis Anes for evaluation of asthma, allergic rhinitis, and atopic dermatitis on Dupixent.    At today's visit, she reports her asthma has been moderately well-controlled with intermittent symptoms including chest tightness, shortness of breath which is limiting activity, occasional wheeze, and dry cough occurring over the last 6 months.  She does report that she occasionally has cough producing mucus in the morning which clears after the morning.  She continues Breo 100-1 puff once a day and has not used albuterol since her last visit to this clinic.  She continues Dupixent injections 300 mg once every 14 days for control of atopic dermatitis.  She does report that she is not consistent with Dupixent injection administration.    Allergic rhinitis is reported as moderately well-controlled with symptoms including nasal congestion, sneezing, and postnasal drainage.  She does report her nostrils feel dry frequently.  She continues cetirizine 10 mg once a day as she has done for the last several years.  She is not currently using Flonase, azelastine, or nasal saline rinses.  She has not use nasal saline gel.  Her last environmental allergy testing was on 12/16/2020 and was positive to grass pollen, weed pollen, ragweed pollen, tree pollen, mold, dust mite, cat, dog, feather, horse, and cockroach.  Allergen immunotherapy was discussed in great detail at today's visit and she is moderately interested in beginning allergen  immunotherapy.  She reports she is [employer] and about 1 month and will be looking into affordability for allergen immunotherapy at that time.  Atopic dermatitis is reported as moderately well-controlled with red and itchy areas occurring in a flare in remission pattern.  She reports that she experiences frequent flares occurring on her left foot.  She continues a twice a day moisturizing routine as well as Eucrisa and tacrolimus as needed.  She continues Dupixent 300 mg injections once every 2 weeks with no large or local reactions.  She reports a significant decrease in her symptoms of atopic dermatitis while continuing on Dupixent injections.  Reflux is reported as moderately well-controlled with occasional heartburn as the main symptom.  She continues esomeprazole 30 minutes before her first meal each day.  Her current medications are listed in the chart.   Drug Allergies:  Allergies  Allergen Reactions   Omeprazole Other (See Comments)   Zantac [Ranitidine] Other (See Comments)    Physical Exam: BP (!) 154/80   Pulse (!) 105   Temp 98.2 F (36.8 C) (Temporal)   Resp 20   Wt 183 lb (83 kg)   SpO2 95%   BMI 32.02 kg/m    Physical Exam Vitals reviewed.  Constitutional:      Appearance: Normal appearance.  HENT:     Head: Normocephalic and atraumatic.     Right Ear: Tympanic membrane normal.     Left Ear: Tympanic membrane normal.     Nose:     Comments: Bilateral nares edematous and pale with thin clear nasal drainage noted.  Pharynx normal.  Ears normal.  Eyes normal.    Mouth/Throat:     Pharynx: Oropharynx is clear.  Eyes:     Conjunctiva/sclera: Conjunctivae normal.  Cardiovascular:     Rate and Rhythm: Normal rate and regular rhythm.     Heart sounds: Normal heart sounds. No murmur heard. Pulmonary:     Effort: Pulmonary effort is normal.     Breath sounds: Normal breath sounds.     Comments: Lungs clear to auscultation Musculoskeletal:        General:  Normal range of motion.     Cervical back: Normal range of motion and neck supple.  Skin:    General: Skin is warm and dry.  Neurological:     Mental Status: She is alert and oriented to person, place, and time.  Psychiatric:        Mood and Affect: Mood normal.        Behavior: Behavior normal.        Thought Content: Thought content normal.        Judgment: Judgment normal.     Diagnostics: FVC 2.81 which is 96% of predicted value, FEV1 2.31 which is 101% of predicted value.  Spirometry indicates normal ventilatory function.  Assessment and Plan: 1. Not well controlled moderate persistent asthma   2. Seasonal and perennial allergic rhinitis   3. Dyshidrotic eczema   4. Gastroesophageal reflux disease, unspecified whether esophagitis present     No orders of the defined types were placed in this encounter.   Patient Instructions  Asthma Begin trelegy 200-1 puff once a day to prevent cough or wheeze Continue albuterol 2 puffs once every 4 hours as needed for cough or wheeze You may use albuterol 2 puffs 5 to 15 minutes before activity to decrease cough or wheeze  Allergic rhinitis Continue allergen avoidance measures directed toward grass pollen, weed pollen, ragweed pollen, tree pollen, mold, dust mite, cat, dog, horse, feather, and cockroach as listed below Continue Flonase 2 sprays in each nostril once a day as needed for stuffy nose Continue azelastine 2 sprays in each nostril up to twice a day for nasal symptoms Begin levocetirizine 5 mg once a day as needed for runny nose. This will replace cetirizine for now. Remember to rotate to a different antihistamine about every 3 months. Some examples of over the counter antihistamines include Zyrtec (cetirizine), Xyzal (levocetirizine), Allegra (fexofenadine), and Claritin (loratidine).  Consider allergen immunotherapy if your symptoms are not well-controlled with the treatment plan as listed above. Written information  provided  Atopic dermatitis Continue a twice a day moisturizing routine Continue tacrolimus to red and itchy areas up to twice a day as needed Continue Eucrisa to red and itchy areas twice a day as needed Continue Dupixent 300 mg once every 14 days for control of atopic dermatitis  Reflux Continue dietary lifestyle modifications as listed below Continue esomeprazole daily as previously prescribed.  Take this medication at least 30 minutes before your first meal for best results  Call the clinic if this treatment plan is not working well for you.  Follow up in 2 months or sooner if needed.   Return in about 2 months (around 08/11/2023), or if symptoms worsen or fail to improve.    Thank you for the opportunity to care for this patient.  Please do not hesitate to contact me with questions.  Thermon Leyland, FNP [facility]

## 2023-06-10 NOTE — Patient Instructions (Addendum)
Asthma Begin trelegy 200-1 puff once a day to prevent cough or wheeze Continue albuterol 2 puffs once every 4 hours as needed for cough or wheeze You may use albuterol 2 puffs 5 to 15 minutes before activity to decrease cough or wheeze  Allergic rhinitis Continue allergen avoidance measures directed toward grass pollen, weed pollen, ragweed pollen, tree pollen, mold, dust mite, cat, dog, horse, feather, and cockroach as listed below Continue Flonase 2 sprays in each nostril once a day as needed for stuffy nose Continue azelastine 2 sprays in each nostril up to twice a day for nasal symptoms Begin levocetirizine 5 mg once a day as needed for runny nose. This will replace cetirizine for now. Remember to rotate to a different antihistamine about every 3 months. Some examples of over the counter antihistamines include Zyrtec (cetirizine), Xyzal (levocetirizine), Allegra (fexofenadine), and Claritin (loratidine).  Consider allergen immunotherapy if your symptoms are not well-controlled with the treatment plan as listed above. Written information provided  Atopic dermatitis Continue a twice a day moisturizing routine Continue tacrolimus to red and itchy areas up to twice a day as needed Continue Eucrisa to red and itchy areas twice a day as needed Continue Dupixent 300 mg once every 14 days for control of atopic dermatitis  Reflux Continue dietary lifestyle modifications as listed below Continue esomeprazole daily as previously prescribed.  Take this medication at least 30 minutes before your first meal for best results  Call the clinic if this treatment plan is not working well for you.  Follow up in 2 months or sooner if needed.  Reducing Pollen Exposure The American Academy of Allergy, Asthma and Immunology suggests the following steps to reduce your exposure to pollen during allergy seasons. Do not hang sheets or clothing out to dry; pollen may collect on these items. Do not mow lawns or  spend time around freshly cut grass; mowing stirs up pollen. Keep windows closed at night.  Keep car windows closed while driving. Minimize morning activities outdoors, a time when pollen counts are usually at their highest. Stay indoors as much as possible when pollen counts or humidity is high and on windy days when pollen tends to remain in the air longer. Use air conditioning when possible.  Many air conditioners have filters that trap the pollen spores. Use a HEPA room air filter to remove pollen form the indoor air you breathe.  Control of Mold Allergen Mold and fungi can grow on a variety of surfaces provided certain temperature and moisture conditions exist.  Outdoor molds grow on plants, decaying vegetation and soil.  The major outdoor mold, Alternaria and Cladosporium, are found in very high numbers during hot and dry conditions.  Generally, a late Summer - Fall peak is seen for common outdoor fungal spores.  Rain will temporarily lower outdoor mold spore count, but counts rise rapidly when the rainy period ends.  The most important indoor molds are Aspergillus and Penicillium.  Dark, humid and poorly ventilated basements are ideal sites for mold growth.  The next most common sites of mold growth are the bathroom and the kitchen.  Outdoor [employer] Use air conditioning and keep windows closed Avoid exposure to decaying vegetation. Avoid leaf raking. Avoid grain handling. Consider wearing a face mask if working in moldy areas.  Indoor Mold Control Maintain humidity below 50%. Clean washable surfaces with 5% bleach solution. Remove sources e.g. Contaminated carpets.   Control of Dust Mite Allergen Dust mites play a major role in allergic  asthma and rhinitis. They occur in environments with high humidity wherever human skin is found. Dust mites absorb humidity from the atmosphere (ie, they do not drink) and feed on organic matter (including shed human and animal skin). Dust mites  are a microscopic type of insect that you cannot see with the naked eye. High levels of dust mites have been detected from mattresses, pillows, carpets, upholstered furniture, bed covers, clothes, soft toys and any woven material. The principal allergen of the dust mite is found in its feces. A gram of dust may contain 1,000 mites and 250,000 fecal particles. Mite antigen is easily measured in the air during house cleaning activities. Dust mites do not bite and do not cause harm to humans, other than by triggering allergies/asthma.  Ways to decrease your exposure to dust mites in your home:  1. Encase mattresses, box springs and pillows with a mite-impermeable barrier or cover  2. Wash sheets, blankets and drapes weekly in hot water (130 F) with detergent and dry them in a dryer on the hot setting.  3. Have the room cleaned frequently with a vacuum cleaner and a damp dust-mop. For carpeting or rugs, vacuuming with a vacuum cleaner equipped with a high-efficiency particulate air (HEPA) filter. The dust mite allergic individual should not be in a room which is being cleaned and should wait 1 hour after cleaning before going into the room.  4. Do not sleep on upholstered furniture (eg, couches).  5. If possible removing carpeting, upholstered furniture and drapery from the home is ideal. Horizontal blinds should be eliminated in the rooms where the person spends the most time (bedroom, study, television room). Washable vinyl, roller-type shades are optimal.  6. Remove all non-washable stuffed toys from the bedroom. Wash stuffed toys weekly like sheets and blankets above.  7. Reduce indoor humidity to less than 50%. Inexpensive humidity monitors can be purchased at most hardware stores. Do not use a humidifier as can make the problem worse and are not recommended.  Control of Dog or Cat Allergen Avoidance is the best way to manage a dog or cat allergy. If you have a dog or cat and are allergic to dog  or cats, consider removing the dog or cat from the home. If you have a dog or cat but don't want to find it a new home, or if your family wants a pet even though someone in the household is allergic, here are some strategies that may help keep symptoms at bay:  Keep the pet out of your bedroom and restrict it to only a few rooms. Be advised that keeping the dog or cat in only one room will not limit the allergens to that room. Don't pet, hug or kiss the dog or cat; if you do, wash your hands with soap and water. High-efficiency particulate air (HEPA) cleaners run continuously in a bedroom or living room can reduce allergen levels over time. Regular use of a high-efficiency vacuum cleaner or a central vacuum can reduce allergen levels. Giving your dog or cat a bath at least once a week can reduce airborne allergen.  Control of Cockroach Allergen Cockroach allergen has been identified as an important cause of acute attacks of asthma, especially in urban settings.  There are fifty-five species of cockroach that exist in the [place], however only three, the [place], [place] species produce allergen that can affect patients with Asthma.  Allergens can be obtained from fecal particles, egg casings and secretions from  cockroaches.    Remove food sources. Reduce access to water. Seal access and entry points. Spray runways with 0.5-1% Diazinon or Chlorpyrifos Blow boric acid power under stoves and refrigerator. Place bait stations (hydramethylnon) at feeding sites.

## 2023-06-20 ENCOUNTER — Other Ambulatory Visit: Payer: Self-pay | Admitting: *Deleted

## 2023-06-20 ENCOUNTER — Ambulatory Visit: Payer: BC Managed Care – PPO | Admitting: Allergy & Immunology

## 2023-06-20 MED ORDER — DUPIXENT 300 MG/2ML ~~LOC~~ SOSY: 300.0000 mg | PREFILLED_SYRINGE | SUBCUTANEOUS | 11 refills | Status: AC

## 2023-07-15 ENCOUNTER — Other Ambulatory Visit: Payer: Self-pay | Admitting: Family Medicine

## 2023-07-29 ENCOUNTER — Other Ambulatory Visit: Payer: Self-pay | Admitting: Family Medicine

## 2023-08-12 ENCOUNTER — Other Ambulatory Visit: Payer: Self-pay

## 2023-08-12 ENCOUNTER — Encounter: Payer: Self-pay | Admitting: Family Medicine

## 2023-08-12 ENCOUNTER — Ambulatory Visit: Payer: Medicare PPO | Admitting: Family Medicine

## 2023-08-12 VITALS — BP 140/80 | HR 96 | Temp 97.9°F | Resp 16 | Wt 178.3 lb

## 2023-08-12 DIAGNOSIS — L301 Dyshidrosis [pompholyx]: Secondary | ICD-10-CM

## 2023-08-12 DIAGNOSIS — K219 Gastro-esophageal reflux disease without esophagitis: Secondary | ICD-10-CM | POA: Diagnosis not present

## 2023-08-12 DIAGNOSIS — J4540 Moderate persistent asthma, uncomplicated: Secondary | ICD-10-CM

## 2023-08-12 DIAGNOSIS — J3089 Other allergic rhinitis: Secondary | ICD-10-CM | POA: Diagnosis not present

## 2023-08-12 DIAGNOSIS — J302 Other seasonal allergic rhinitis: Secondary | ICD-10-CM

## 2023-08-12 MED ORDER — FAMOTIDINE 20 MG PO TABS
20.0000 mg | ORAL_TABLET | Freq: Two times a day (BID) | ORAL | 5 refills | Status: DC
Start: 2023-08-12 — End: 2023-12-02

## 2023-08-12 MED ORDER — AZELASTINE HCL 0.15 % NA SOLN: 1.0000 | Freq: Two times a day (BID) | NASAL | 5 refills | Status: AC | PRN

## 2023-08-12 NOTE — Progress Notes (Addendum)
[address] [place] [address] Dept: [phone]  FOLLOW UP NOTE  Patient ID: [name] [name], female    DOB: [birth year]  Age: 68 y.o. MRN: [id number] Date of Office Visit: 08/12/2023  Assessment  Chief Complaint: Follow-up  HPI [name] [name] is a 67 year old female who presents to the clinic for follow-up visit.  She was last seen in this clinic on 06/10/2023 by Thermon Leyland, FNP, for evaluation of asthma, allergic rhinitis, atopic dermatitis, and reflux.   At today's visit, she reports her asthma has been moderately well-controlled with symptoms including shortness of breath with activity and rest and cough which is mostly dry and usually occurs during the day.  She reports the symptoms started about 1 to 2 months ago on a regular basis.  She continues Trelegy 100-1 puff once a day use albuterol for relief of symptoms.  She continues Dupixent 300 mg once every 2 weeks for atopic dermatitis controlled.  She reports a slight decrease in her symptoms of asthma while continuing on Dupixent.    Allergic rhinitis is reported as moderately well-controlled with symptoms including nasal congestion, sneezing, and postnasal drainage.  She reports these are ongoing on a regular basis with no seasonal variation.  She continues azelastine, Flonase, and nasal saline rinses as needed.  She continues levocetirizine 5 mg once a day.  Her last environmental allergy testing was on 12/16/2020 and was positive to grass pollen, weed pollen, ragweed pollen, tree pollen, mold, dust mite, cat, dog, feather, horse, and cockroach.  She is moderately interested in allergen immunotherapy once her asthma symptoms are well-controlled.  Atopic dermatitis is reported as moderately well-controlled with red and itchy areas occurring mainly on her hands and fingers.  She has previously used [place] with relief of symptoms, however, her insurance has denied Eucrisa at this time.  She continues Dupixent once every other week  with a moderate relief of her symptoms.  She reports that she continues to experience breakouts occurring randomly and can experience several breakouts in a row and then have no breakouts for years.  Reflux is reported as poorly controlled with heartburn occurring several days of the week.  She continues Nexium daily with only moderate relief of symptoms.  Her current medications are listed in the chart.  Of note, we briefly discussed evaluation with pulmonology, however, she is interested in adding famotidine to her medication regimen.  If her asthma symptoms do not improve with treatment of reflux, we will move forward with referral to pulmonology for further evaluation.  Her current medications are listed in the chart.  Drug Allergies:  Allergies  Allergen Reactions   Omeprazole Other (See Comments)   Zantac [Ranitidine] Other (See Comments)    Physical Exam: BP (!) 140/80   Pulse 96   Temp 97.9 F (36.6 C) (Temporal)   Resp 16   Wt 178 lb 4.8 oz (80.9 kg)   SpO2 95%   BMI 31.20 kg/m    Physical Exam Vitals reviewed.  Constitutional:      Appearance: Normal appearance.  HENT:     Head: Normocephalic and atraumatic.     Right Ear: Tympanic membrane normal.     Left Ear: Tympanic membrane normal.     Nose:     Comments: Bilateral nares slightly erythematous with thin clear nasal drainage noted.  Pharynx normal.  Ears normal.  Eyes normal.    Mouth/Throat:     Pharynx: Oropharynx is clear.  Eyes:     Conjunctiva/sclera: Conjunctivae normal.  Cardiovascular:     Rate and Rhythm: Normal rate and regular rhythm.     Heart sounds: Normal heart sounds. No murmur heard. Pulmonary:     Effort: Pulmonary effort is normal.     Breath sounds: Normal breath sounds.     Comments: Lungs clear to auscultation Musculoskeletal:        General: Normal range of motion.     Cervical back: Normal range of motion and neck supple.  Skin:    General: Skin is warm and dry.     Comments:  Dry eczematous patches noted on bilateral hands.  No open areas or drainage noted.  Neurological:     Mental Status: She is alert and oriented to person, place, and time.  Psychiatric:        Mood and Affect: Mood normal.        Behavior: Behavior normal.        Thought Content: Thought content normal.        Judgment: Judgment normal.     Diagnostics: FVC 2.83 which is 97% of predicted value, FEV1 2.47 which is 109% of predicted value.  Spirometry indicates normal ventilatory function.  Assessment and Plan: 1. Not well controlled moderate persistent asthma   2. Seasonal and perennial allergic rhinitis   3. Dyshidrotic eczema   4. Gastroesophageal reflux disease, unspecified whether esophagitis present     Meds ordered this encounter  Medications   Azelastine HCl 0.15 % SOLN    Sig: Place 1-2 sprays into the nose 2 (two) times daily as needed (drainage).    Dispense:  30 mL    Refill:  5   famotidine (PEPCID) 20 MG tablet    Sig: Take 1 tablet (20 mg total) by mouth 2 (two) times daily.    Dispense:  60 tablet    Refill:  5    Patient Instructions  Asthma Continue trelegy 200-1 puff once a day to prevent cough or wheeze Continue albuterol 2 puffs once every 4 hours as needed for cough or wheeze You may use albuterol 2 puffs 5 to 15 minutes before activity to decrease cough or wheeze  Allergic rhinitis Continue allergen avoidance measures directed toward grass pollen, weed pollen, ragweed pollen, tree pollen, mold, dust mite, cat, dog, horse, feather, and cockroach as listed below Continue Flonase 2 sprays in each nostril once a day as needed for stuffy nose Continue azelastine 2 sprays in each nostril up to twice a day for nasal symptoms Begin levocetirizine 5 mg once a day as needed for runny nose. This will replace cetirizine for now. Remember to rotate to a different antihistamine about every 3 months. Some examples of over the counter antihistamines include Zyrtec  (cetirizine), Xyzal (levocetirizine), Allegra (fexofenadine), and Claritin (loratidine).  Consider allergen immunotherapy if your symptoms are not well-controlled with the treatment plan as listed above. Written information provided  Atopic dermatitis Continue a twice a day moisturizing routine Continue tacrolimus to red and itchy areas up to twice a day as needed Continue Dupixent 300 mg once every 14 days for control of atopic dermatitis  Reflux Continue dietary lifestyle modifications as listed below Begin famotidine 20 mg twice a day to prevent reflux. This may help your breathing Continue esomeprazole daily as previously prescribed.  Take this medication at least 30 minutes before your first meal for best results  Call the clinic if this treatment plan is not working well for you.  Follow up in 2 months or sooner if needed.  Return in about 2 months (around 10/12/2023), or if symptoms worsen or fail to improve.    Thank you for the opportunity to care for this patient.  Please do not hesitate to contact me with questions.  Thermon Leyland, FNP [facility]

## 2023-08-12 NOTE — Patient Instructions (Addendum)
Asthma Continue trelegy 200-1 puff once a day to prevent cough or wheeze Continue albuterol 2 puffs once every 4 hours as needed for cough or wheeze You may use albuterol 2 puffs 5 to 15 minutes before activity to decrease cough or wheeze  Allergic rhinitis Continue allergen avoidance measures directed toward grass pollen, weed pollen, ragweed pollen, tree pollen, mold, dust mite, cat, dog, horse, feather, and cockroach as listed below Continue Flonase 2 sprays in each nostril once a day as needed for stuffy nose Continue azelastine 2 sprays in each nostril up to twice a day for nasal symptoms Begin levocetirizine 5 mg once a day as needed for runny nose. This will replace cetirizine for now. Remember to rotate to a different antihistamine about every 3 months. Some examples of over the counter antihistamines include Zyrtec (cetirizine), Xyzal (levocetirizine), Allegra (fexofenadine), and Claritin (loratidine).  Consider allergen immunotherapy if your symptoms are not well-controlled with the treatment plan as listed above. Written information provided  Atopic dermatitis Continue a twice a day moisturizing routine Continue tacrolimus to red and itchy areas up to twice a day as needed Continue Dupixent 300 mg once every 14 days for control of atopic dermatitis  Reflux Continue dietary lifestyle modifications as listed below Begin famotidine 20 mg twice a day to prevent reflux. This may help your breathing Continue esomeprazole daily as previously prescribed.  Take this medication at least 30 minutes before your first meal for best results  Call the clinic if this treatment plan is not working well for you.  Follow up in 2 months or sooner if needed.  Reducing Pollen Exposure The American Academy of Allergy, Asthma and Immunology suggests the following steps to reduce your exposure to pollen during allergy seasons. Do not hang sheets or clothing out to dry; pollen may collect on these  items. Do not mow lawns or spend time around freshly cut grass; mowing stirs up pollen. Keep windows closed at night.  Keep car windows closed while driving. Minimize morning activities outdoors, a time when pollen counts are usually at their highest. Stay indoors as much as possible when pollen counts or humidity is high and on windy days when pollen tends to remain in the air longer. Use air conditioning when possible.  Many air conditioners have filters that trap the pollen spores. Use a HEPA room air filter to remove pollen form the indoor air you breathe.  Control of Mold Allergen Mold and fungi can grow on a variety of surfaces provided certain temperature and moisture conditions exist.  Outdoor molds grow on plants, decaying vegetation and soil.  The major outdoor mold, Alternaria and Cladosporium, are found in very high numbers during hot and dry conditions.  Generally, a late Summer - Fall peak is seen for common outdoor fungal spores.  Rain will temporarily lower outdoor mold spore count, but counts rise rapidly when the rainy period ends.  The most important indoor molds are Aspergillus and Penicillium.  Dark, humid and poorly ventilated basements are ideal sites for mold growth.  The next most common sites of mold growth are the bathroom and the kitchen.  Outdoor [employer] Use air conditioning and keep windows closed Avoid exposure to decaying vegetation. Avoid leaf raking. Avoid grain handling. Consider wearing a face mask if working in moldy areas.  Indoor Mold Control Maintain humidity below 50%. Clean washable surfaces with 5% bleach solution. Remove sources e.g. Contaminated carpets.   Control of Dust Mite Allergen Dust mites play a major  role in allergic asthma and rhinitis. They occur in environments with high humidity wherever human skin is found. Dust mites absorb humidity from the atmosphere (ie, they do not drink) and feed on organic matter (including shed human and  animal skin). Dust mites are a microscopic type of insect that you cannot see with the naked eye. High levels of dust mites have been detected from mattresses, pillows, carpets, upholstered furniture, bed covers, clothes, soft toys and any woven material. The principal allergen of the dust mite is found in its feces. A gram of dust may contain 1,000 mites and 250,000 fecal particles. Mite antigen is easily measured in the air during house cleaning activities. Dust mites do not bite and do not cause harm to humans, other than by triggering allergies/asthma.  Ways to decrease your exposure to dust mites in your home:  1. Encase mattresses, box springs and pillows with a mite-impermeable barrier or cover  2. Wash sheets, blankets and drapes weekly in hot water (130 F) with detergent and dry them in a dryer on the hot setting.  3. Have the room cleaned frequently with a vacuum cleaner and a damp dust-mop. For carpeting or rugs, vacuuming with a vacuum cleaner equipped with a high-efficiency particulate air (HEPA) filter. The dust mite allergic individual should not be in a room which is being cleaned and should wait 1 hour after cleaning before going into the room.  4. Do not sleep on upholstered furniture (eg, couches).  5. If possible removing carpeting, upholstered furniture and drapery from the home is ideal. Horizontal blinds should be eliminated in the rooms where the person spends the most time (bedroom, study, television room). Washable vinyl, roller-type shades are optimal.  6. Remove all non-washable stuffed toys from the bedroom. Wash stuffed toys weekly like sheets and blankets above.  7. Reduce indoor humidity to less than 50%. Inexpensive humidity monitors can be purchased at most hardware stores. Do not use a humidifier as can make the problem worse and are not recommended.  Control of Dog or Cat Allergen Avoidance is the best way to manage a dog or cat allergy. If you have a dog or  cat and are allergic to dog or cats, consider removing the dog or cat from the home. If you have a dog or cat but don't want to find it a new home, or if your family wants a pet even though someone in the household is allergic, here are some strategies that may help keep symptoms at bay:  Keep the pet out of your bedroom and restrict it to only a few rooms. Be advised that keeping the dog or cat in only one room will not limit the allergens to that room. Don't pet, hug or kiss the dog or cat; if you do, wash your hands with soap and water. High-efficiency particulate air (HEPA) cleaners run continuously in a bedroom or living room can reduce allergen levels over time. Regular use of a high-efficiency vacuum cleaner or a central vacuum can reduce allergen levels. Giving your dog or cat a bath at least once a week can reduce airborne allergen.  Control of Cockroach Allergen Cockroach allergen has been identified as an important cause of acute attacks of asthma, especially in urban settings.  There are fifty-five species of cockroach that exist in the [place], however only three, the [place], [place] species produce allergen that can affect patients with Asthma.  Allergens can be obtained from fecal particles, egg casings  and secretions from cockroaches.    Remove food sources. Reduce access to water. Seal access and entry points. Spray runways with 0.5-1% Diazinon or Chlorpyrifos Blow boric acid power under stoves and refrigerator. Place bait stations (hydramethylnon) at feeding sites.

## 2023-09-23 ENCOUNTER — Other Ambulatory Visit (HOSPITAL_COMMUNITY): Payer: Self-pay

## 2023-09-26 ENCOUNTER — Other Ambulatory Visit (HOSPITAL_COMMUNITY): Payer: Self-pay

## 2023-10-10 DIAGNOSIS — E785 Hyperlipidemia, unspecified: Secondary | ICD-10-CM | POA: Diagnosis not present

## 2023-10-10 DIAGNOSIS — I10 Essential (primary) hypertension: Secondary | ICD-10-CM | POA: Diagnosis not present

## 2023-10-10 DIAGNOSIS — Z713 Dietary counseling and surveillance: Secondary | ICD-10-CM | POA: Diagnosis not present

## 2023-10-10 DIAGNOSIS — Z6829 Body mass index (BMI) 29.0-29.9, adult: Secondary | ICD-10-CM | POA: Diagnosis not present

## 2023-10-10 DIAGNOSIS — E663 Overweight: Secondary | ICD-10-CM | POA: Diagnosis not present

## 2023-10-10 DIAGNOSIS — R7303 Prediabetes: Secondary | ICD-10-CM | POA: Diagnosis not present

## 2023-10-16 NOTE — Progress Notes (Deleted)
   [address] [place] [address] Dept: [phone]  FOLLOW UP NOTE  Patient ID: [name] [name], female    DOB: 15-Jul-1956  Age: 67 y.o. MRN: [id number] Date of Office Visit: 10/17/2023  Assessment  Chief Complaint: No chief complaint on file.  HPI [name] [name] is a 67 year old female who presents to the clinic for follow-up visit.  She was last seen in this clinic on 08/12/2023 by Thermon Leyland, FNP, for evaluation of asthma, allergic rhinitis, atopic dermatitis on Dupixent, and reflux.   Her last environmental allergy testing was on 12/16/2020 and was positive to grass pollen, weed pollen, ragweed pollen, tree pollen, mold, dust mite, cat, dog, feather, horse, and cockroach.  Discussed the use of AI scribe software for clinical note transcription with the patient, who gave verbal consent to proceed.  History of Present Illness             Drug Allergies:  Allergies  Allergen Reactions   Omeprazole Other (See Comments)   Zantac [Ranitidine] Other (See Comments)    Physical Exam: There were no vitals taken for this visit.   Physical Exam  Diagnostics:    Assessment and Plan: No diagnosis found.  No orders of the defined types were placed in this encounter.   There are no Patient Instructions on file for this visit.  No follow-ups on file.    Thank you for the opportunity to care for this patient.  Please do not hesitate to contact me with questions.  Thermon Leyland, FNP [facility]

## 2023-10-17 ENCOUNTER — Ambulatory Visit: Payer: Medicare PPO | Admitting: Family Medicine

## 2023-10-23 NOTE — Progress Notes (Signed)
   [address] [place] [address] Dept: [phone]  FOLLOW UP NOTE  Patient ID: [name] [name], female    DOB: May 17, 1956  Age: 67 y.o. MRN: [id number] Date of Office Visit: 10/24/2023  Assessment  Chief Complaint: No chief complaint on file.  HPI [name] [name] is a 67 year old female who presents to the clinic for follow-up visit.  She was last seen in this clinic on 08/12/2023 by Thermon Leyland, FNP, for evaluation of asthma, allergic rhinitis, atopic dermatitis on Dupixent, and reflux.   Her last environmental allergy testing was on 12/16/2020 and was positive to grass pollen, weed pollen, ragweed pollen, tree pollen, mold, dust mite, cat, dog, feather, horse, and cockroach.  Discussed the use of AI scribe software for clinical note transcription with the patient, who gave verbal consent to proceed.  History of Present Illness             Drug Allergies:  Allergies  Allergen Reactions  . Omeprazole Other (See Comments)  . Zantac [Ranitidine] Other (See Comments)    Physical Exam: There were no vitals taken for this visit.   Physical Exam  Diagnostics:    Assessment and Plan: No diagnosis found.  No orders of the defined types were placed in this encounter.   There are no Patient Instructions on file for this visit.  No follow-ups on file.    Thank you for the opportunity to care for this patient.  Please do not hesitate to contact me with questions.  Thermon Leyland, FNP [facility]

## 2023-10-23 NOTE — Patient Instructions (Incomplete)
Asthma Continue trelegy 200-1 puff once a day to prevent cough or wheeze Continue albuterol 2 puffs once every 4 hours as needed for cough or wheeze You may use albuterol 2 puffs 5 to 15 minutes before activity to decrease cough or wheeze  Allergic rhinitis Continue allergen avoidance measures directed toward grass pollen, weed pollen, ragweed pollen, tree pollen, mold, dust mite, cat, dog, horse, feather, and cockroach as listed below Continue Flonase 2 sprays in each nostril once a day as needed for stuffy nose Continue azelastine 2 sprays in each nostril up to twice a day for nasal symptoms Begin levocetirizine 5 mg once a day as needed for runny nose. This will replace cetirizine for now. Remember to rotate to a different antihistamine about every 3 months. Some examples of over the counter antihistamines include Zyrtec (cetirizine), Xyzal (levocetirizine), Allegra (fexofenadine), and Claritin (loratidine).  Consider allergen immunotherapy if your symptoms are not well-controlled with the treatment plan as listed above. Written information provided  Atopic dermatitis Continue a twice a day moisturizing routine Continue tacrolimus to red and itchy areas up to twice a day as needed Continue Dupixent 300 mg once every 14 days for control of atopic dermatitis  Reflux Continue dietary lifestyle modifications as listed below Begin famotidine 20 mg twice a day to prevent reflux. This may help your breathing Continue esomeprazole daily as previously prescribed.  Take this medication at least 30 minutes before your first meal for best results  Call the clinic if this treatment plan is not working well for you.  Follow up in 2 months or sooner if needed.  Reducing Pollen Exposure The American Academy of Allergy, Asthma and Immunology suggests the following steps to reduce your exposure to pollen during allergy seasons. Do not hang sheets or clothing out to dry; pollen may collect on these  items. Do not mow lawns or spend time around freshly cut grass; mowing stirs up pollen. Keep windows closed at night.  Keep car windows closed while driving. Minimize morning activities outdoors, a time when pollen counts are usually at their highest. Stay indoors as much as possible when pollen counts or humidity is high and on windy days when pollen tends to remain in the air longer. Use air conditioning when possible.  Many air conditioners have filters that trap the pollen spores. Use a HEPA room air filter to remove pollen form the indoor air you breathe.  Control of Mold Allergen Mold and fungi can grow on a variety of surfaces provided certain temperature and moisture conditions exist.  Outdoor molds grow on plants, decaying vegetation and soil.  The major outdoor mold, Alternaria and Cladosporium, are found in very high numbers during hot and dry conditions.  Generally, a late Summer - Fall peak is seen for common outdoor fungal spores.  Rain will temporarily lower outdoor mold spore count, but counts rise rapidly when the rainy period ends.  The most important indoor molds are Aspergillus and Penicillium.  Dark, humid and poorly ventilated basements are ideal sites for mold growth.  The next most common sites of mold growth are the bathroom and the kitchen.  Outdoor [employer] Use air conditioning and keep windows closed Avoid exposure to decaying vegetation. Avoid leaf raking. Avoid grain handling. Consider wearing a face mask if working in moldy areas.  Indoor Mold Control Maintain humidity below 50%. Clean washable surfaces with 5% bleach solution. Remove sources e.g. Contaminated carpets.   Control of Dust Mite Allergen Dust mites play a major  role in allergic asthma and rhinitis. They occur in environments with high humidity wherever human skin is found. Dust mites absorb humidity from the atmosphere (ie, they do not drink) and feed on organic matter (including shed human and  animal skin). Dust mites are a microscopic type of insect that you cannot see with the naked eye. High levels of dust mites have been detected from mattresses, pillows, carpets, upholstered furniture, bed covers, clothes, soft toys and any woven material. The principal allergen of the dust mite is found in its feces. A gram of dust may contain 1,000 mites and 250,000 fecal particles. Mite antigen is easily measured in the air during house cleaning activities. Dust mites do not bite and do not cause harm to humans, other than by triggering allergies/asthma.  Ways to decrease your exposure to dust mites in your home:  1. Encase mattresses, box springs and pillows with a mite-impermeable barrier or cover  2. Wash sheets, blankets and drapes weekly in hot water (130 F) with detergent and dry them in a dryer on the hot setting.  3. Have the room cleaned frequently with a vacuum cleaner and a damp dust-mop. For carpeting or rugs, vacuuming with a vacuum cleaner equipped with a high-efficiency particulate air (HEPA) filter. The dust mite allergic individual should not be in a room which is being cleaned and should wait 1 hour after cleaning before going into the room.  4. Do not sleep on upholstered furniture (eg, couches).  5. If possible removing carpeting, upholstered furniture and drapery from the home is ideal. Horizontal blinds should be eliminated in the rooms where the person spends the most time (bedroom, study, television room). Washable vinyl, roller-type shades are optimal.  6. Remove all non-washable stuffed toys from the bedroom. Wash stuffed toys weekly like sheets and blankets above.  7. Reduce indoor humidity to less than 50%. Inexpensive humidity monitors can be purchased at most hardware stores. Do not use a humidifier as can make the problem worse and are not recommended.  Control of Dog or Cat Allergen Avoidance is the best way to manage a dog or cat allergy. If you have a dog or  cat and are allergic to dog or cats, consider removing the dog or cat from the home. If you have a dog or cat but don't want to find it a new home, or if your family wants a pet even though someone in the household is allergic, here are some strategies that may help keep symptoms at bay:  Keep the pet out of your bedroom and restrict it to only a few rooms. Be advised that keeping the dog or cat in only one room will not limit the allergens to that room. Don't pet, hug or kiss the dog or cat; if you do, wash your hands with soap and water. High-efficiency particulate air (HEPA) cleaners run continuously in a bedroom or living room can reduce allergen levels over time. Regular use of a high-efficiency vacuum cleaner or a central vacuum can reduce allergen levels. Giving your dog or cat a bath at least once a week can reduce airborne allergen.  Control of Cockroach Allergen Cockroach allergen has been identified as an important cause of acute attacks of asthma, especially in urban settings.  There are fifty-five species of cockroach that exist in the [place], however only three, the [place], [place] species produce allergen that can affect patients with Asthma.  Allergens can be obtained from fecal particles, egg casings  and secretions from cockroaches.    Remove food sources. Reduce access to water. Seal access and entry points. Spray runways with 0.5-1% Diazinon or Chlorpyrifos Blow boric acid power under stoves and refrigerator. Place bait stations (hydramethylnon) at feeding sites.

## 2023-10-24 ENCOUNTER — Encounter: Payer: Self-pay | Admitting: Family Medicine

## 2023-10-24 ENCOUNTER — Other Ambulatory Visit: Payer: Self-pay

## 2023-10-24 ENCOUNTER — Ambulatory Visit: Payer: Medicare PPO | Admitting: Family Medicine

## 2023-10-24 VITALS — BP 138/80 | HR 100 | Temp 97.6°F | Resp 20 | Wt 169.9 lb

## 2023-10-24 DIAGNOSIS — J302 Other seasonal allergic rhinitis: Secondary | ICD-10-CM | POA: Diagnosis not present

## 2023-10-24 DIAGNOSIS — L301 Dyshidrosis [pompholyx]: Secondary | ICD-10-CM | POA: Diagnosis not present

## 2023-10-24 DIAGNOSIS — J3089 Other allergic rhinitis: Secondary | ICD-10-CM

## 2023-10-24 DIAGNOSIS — J4540 Moderate persistent asthma, uncomplicated: Secondary | ICD-10-CM

## 2023-10-24 DIAGNOSIS — K219 Gastro-esophageal reflux disease without esophagitis: Secondary | ICD-10-CM | POA: Diagnosis not present

## 2023-11-29 ENCOUNTER — Other Ambulatory Visit: Payer: Self-pay | Admitting: Family Medicine

## 2024-01-21 ENCOUNTER — Other Ambulatory Visit: Payer: Self-pay | Admitting: Family Medicine

## 2024-02-07 ENCOUNTER — Other Ambulatory Visit: Payer: Self-pay | Admitting: Allergy & Immunology

## 2024-02-27 DIAGNOSIS — R7303 Prediabetes: Secondary | ICD-10-CM | POA: Diagnosis not present

## 2024-02-27 DIAGNOSIS — F325 Major depressive disorder, single episode, in full remission: Secondary | ICD-10-CM | POA: Diagnosis not present

## 2024-02-27 DIAGNOSIS — Z0000 Encounter for general adult medical examination without abnormal findings: Secondary | ICD-10-CM | POA: Diagnosis not present

## 2024-02-27 DIAGNOSIS — Z23 Encounter for immunization: Secondary | ICD-10-CM | POA: Diagnosis not present

## 2024-02-27 DIAGNOSIS — I10 Essential (primary) hypertension: Secondary | ICD-10-CM | POA: Diagnosis not present

## 2024-02-27 DIAGNOSIS — E669 Obesity, unspecified: Secondary | ICD-10-CM | POA: Diagnosis not present

## 2024-02-27 DIAGNOSIS — M8580 Other specified disorders of bone density and structure, unspecified site: Secondary | ICD-10-CM | POA: Diagnosis not present

## 2024-02-27 DIAGNOSIS — J309 Allergic rhinitis, unspecified: Secondary | ICD-10-CM | POA: Diagnosis not present

## 2024-02-27 DIAGNOSIS — E785 Hyperlipidemia, unspecified: Secondary | ICD-10-CM | POA: Diagnosis not present

## 2024-03-02 ENCOUNTER — Other Ambulatory Visit: Payer: Self-pay | Admitting: Family Medicine

## 2024-03-02 DIAGNOSIS — Z1211 Encounter for screening for malignant neoplasm of colon: Secondary | ICD-10-CM | POA: Diagnosis not present

## 2024-03-02 DIAGNOSIS — M8580 Other specified disorders of bone density and structure, unspecified site: Secondary | ICD-10-CM

## 2024-03-18 DIAGNOSIS — T1502XA Foreign body in cornea, left eye, initial encounter: Secondary | ICD-10-CM | POA: Diagnosis not present

## 2024-04-22 ENCOUNTER — Ambulatory Visit: Admitting: Podiatrist

## 2024-04-22 ENCOUNTER — Encounter: Payer: Self-pay | Admitting: Podiatrist

## 2024-04-22 DIAGNOSIS — L603 Nail dystrophy: Secondary | ICD-10-CM

## 2024-04-22 NOTE — Patient Instructions (Addendum)
 Asthma Begin Advair 250-1 puff once a day to prevent cough or wheeze Continue albuterol  2 puffs once every 4 hours as needed for cough or wheeze You may use albuterol  2 puffs 5 to 15 minutes before activity to decrease cough or wheeze  Allergic rhinitis Continue allergen avoidance measures directed toward grass pollen, weed pollen, ragweed pollen, tree pollen, mold, dust mite, cat, dog, horse, feather, and cockroach as listed below Continue Flonase  2 sprays in each nostril once a day as needed for stuffy nose Continue azelastine  2 sprays in each nostril up to twice a day for nasal symptoms Continue levocetirizine 5 mg once a day as needed for runny nose. Remember to rotate to a different antihistamine about every 3 months. Some examples of over the counter antihistamines include Zyrtec  (cetirizine ), Xyzal  (levocetirizine), Allegra (fexofenadine), and Claritin (loratidine).  Consider allergen immunotherapy if your symptoms are not well-controlled with the treatment plan as listed above. Written information provided  Atopic dermatitis Continue a twice a day moisturizing routine Continue tacrolimus  to red and itchy areas up to twice a day as needed Continue Eucrisa  to red and itchy areas up to twice a day as needed  Reflux Continue dietary lifestyle modifications as listed below Continue famotidine  20 mg twice a day to prevent reflux.  Continue esomeprazole daily  Call the clinic if this treatment plan is not working well for you.  Follow up in 3 months or sooner if needed.  Reducing Pollen Exposure The American Academy of Allergy , Asthma and Immunology suggests the following steps to reduce your exposure to pollen during allergy  seasons. Do not hang sheets or clothing out to dry; pollen may collect on these items. Do not mow lawns or spend time around freshly cut grass; mowing stirs up pollen. Keep windows closed at night.  Keep car windows closed while driving. Minimize morning  activities outdoors, a time when pollen counts are usually at their highest. Stay indoors as much as possible when pollen counts or humidity is high and on windy days when pollen tends to remain in the air longer. Use air conditioning when possible.  Many air conditioners have filters that trap the pollen spores. Use a HEPA room air filter to remove pollen form the indoor air you breathe.  Control of Mold Allergen Mold and fungi can grow on a variety of surfaces provided certain temperature and moisture conditions exist.  Outdoor molds grow on plants, decaying vegetation and soil.  The major outdoor mold, Alternaria and Cladosporium, are found in very high numbers during hot and dry conditions.  Generally, a late Summer - Fall peak is seen for common outdoor fungal spores.  Rain will temporarily lower outdoor mold spore count, but counts rise rapidly when the rainy period ends.  The most important indoor molds are Aspergillus and Penicillium.  Dark, humid and poorly ventilated basements are ideal sites for mold growth.  The next most common sites of mold growth are the bathroom and the kitchen.  Outdoor [employer] Use air conditioning and keep windows closed Avoid exposure to decaying vegetation. Avoid leaf raking. Avoid grain handling. Consider wearing a face mask if working in moldy areas.  Indoor Mold Control Maintain humidity below 50%. Clean washable surfaces with 5% bleach solution. Remove sources e.g. Contaminated carpets.   Control of Dust Mite Allergen Dust mites play a major role in allergic asthma and rhinitis. They occur in environments with high humidity wherever human skin is found. Dust mites absorb humidity from the atmosphere (ie, they  do not drink) and feed on organic matter (including shed human and animal skin). Dust mites are a microscopic type of insect that you cannot see with the naked eye. High levels of dust mites have been detected from mattresses, pillows, carpets,  upholstered furniture, bed covers, clothes, soft toys and any woven material. The principal allergen of the dust mite is found in its feces. A gram of dust may contain 1,000 mites and 250,000 fecal particles. Mite antigen is easily measured in the air during house cleaning activities. Dust mites do not bite and do not cause harm to humans, other than by triggering allergies/asthma.  Ways to decrease your exposure to dust mites in your home:  1. Encase mattresses, box springs and pillows with a mite-impermeable barrier or cover  2. Wash sheets, blankets and drapes weekly in hot water (130 F) with detergent and dry them in a dryer on the hot setting.  3. Have the room cleaned frequently with a vacuum cleaner and a damp dust-mop. For carpeting or rugs, vacuuming with a vacuum cleaner equipped with a high-efficiency particulate air (HEPA) filter. The dust mite allergic individual should not be in a room which is being cleaned and should wait 1 hour after cleaning before going into the room.  4. Do not sleep on upholstered furniture (eg, couches).  5. If possible removing carpeting, upholstered furniture and drapery from the home is ideal. Horizontal blinds should be eliminated in the rooms where the person spends the most time (bedroom, study, television room). Washable vinyl, roller-type shades are optimal.  6. Remove all non-washable stuffed toys from the bedroom. Wash stuffed toys weekly like sheets and blankets above.  7. Reduce indoor humidity to less than 50%. Inexpensive humidity monitors can be purchased at most hardware stores. Do not use a humidifier as can make the problem worse and are not recommended.  Control of Dog or Cat Allergen Avoidance is the best way to manage a dog or cat allergy . If you have a dog or cat and are allergic to dog or cats, consider removing the dog or cat from the home. If you have a dog or cat but don't want to find it a new home, or if your family wants a pet  even though someone in the household is allergic, here are some strategies that may help keep symptoms at bay:  Keep the pet out of your bedroom and restrict it to only a few rooms. Be advised that keeping the dog or cat in only one room will not limit the allergens to that room. Don't pet, hug or kiss the dog or cat; if you do, wash your hands with soap and water. High-efficiency particulate air (HEPA) cleaners run continuously in a bedroom or living room can reduce allergen levels over time. Regular use of a high-efficiency vacuum cleaner or a central vacuum can reduce allergen levels. Giving your dog or cat a bath at least once a week can reduce airborne allergen.  Control of Cockroach Allergen Cockroach allergen has been identified as an important cause of acute attacks of asthma, especially in urban settings.  There are fifty-five species of cockroach that exist in the [place] , however only three, the [place], [place] and [place] species produce allergen that can affect patients with Asthma.  Allergens can be obtained from fecal particles, egg casings and secretions from cockroaches.    Remove food sources. Reduce access to water. Seal access and entry points. Spray runways with 0.5-1% Diazinon or Chlorpyrifos Blow  boric acid power under stoves and refrigerator. Place bait stations (hydramethylnon) at feeding sites.

## 2024-04-22 NOTE — Progress Notes (Addendum)
 [address] [place] [address] Dept: [phone]  FOLLOW UP NOTE  Patient ID: [name] [name], female    DOB: 16-Jun-1956  Age: 68 y.o. MRN: [id number] Date of Office Visit: 04/23/2024  Assessment  Chief Complaint: Asthma, Eczema, Allergic Rhinitis , Gastroesophageal Reflux, and Follow-up  HPI [name] [name] is a 68 year old female who presents to the clinic for follow-up visit.  She was last seen in this clinic on 10/24/2023 by Marinus Sic, FNP, for evaluation of asthma, allergic rhinitis, atopic dermatitis, and reflux.  At today's visit, she reports her asthma has not been well-controlled over the last 1 to 2 months with symptoms including dry cough and shortness of breath with activity.  She reports that she has decreased her activity level as a result of feeling exhausted and short of breath.  She continues Advair 100 on most days and infrequently uses albuterol  for relief of symptoms.  Of note, she stopped Dupixent  injections in 07/2023 due to change in insurance  Allergic rhinitis is reported as moderately well-controlled with symptoms including nasal congestion, sneezing, postnasal drainage, occasional rhinorrhea, and dry nostrils.  She continues nasal saline rinses as needed and levocetirizine daily.  She is not currently using saline nasal gel, azelastine , or Flonase . Her last environmental allergy  testing was on 12/16/2020 and was positive to grass pollen, weed pollen, ragweed pollen, tree pollen, mold, dust mite, cat, dog, feather, horse, and cockroach.  Atopic dermatitis is reported as moderately well-controlled with occasional rough areas mainly occurring on her arms.  She continues a twice a day moisturizing routine with Shea butter and occasionally uses Eucrisa .  She infrequently uses tacrolimus  with relief of symptoms.  Reflux is reported as well-controlled with no symptoms including heartburn or vomiting.  She continues esomeprazole daily and famotidine  twice a day with  relief of symptoms.  Her current medications are listed in the chart.  Drug Allergies:  Allergies  Allergen Reactions   Omeprazole Other (See Comments)   Zantac [Ranitidine] Other (See Comments)    Physical Exam: There were no vitals taken for this visit.   Physical Exam Vitals reviewed.  Constitutional:      Appearance: Normal appearance.  HENT:     Head: Normocephalic and atraumatic.     Right Ear: Tympanic membrane normal.     Left Ear: Tympanic membrane normal.     Nose:     Comments: Bilateral nares slightly erythematous with thin clear nasal drainage noted.  Pharynx normal.  Ears normal.  Eyes normal.    Mouth/Throat:     Pharynx: Oropharynx is clear.  Eyes:     Conjunctiva/sclera: Conjunctivae normal.  Cardiovascular:     Rate and Rhythm: Normal rate and regular rhythm.     Heart sounds: Normal heart sounds. No murmur heard. Pulmonary:     Effort: Pulmonary effort is normal.     Breath sounds: Normal breath sounds.     Comments: Lungs clear to auscultation Musculoskeletal:        General: Normal range of motion.     Cervical back: Normal range of motion and neck supple.  Skin:    General: Skin is warm and dry.  Neurological:     Mental Status: She is alert and oriented to person, place, and time.  Psychiatric:        Mood and Affect: Mood normal.        Behavior: Behavior normal.        Thought Content: Thought content normal.  Judgment: Judgment normal.     Diagnostics: FVC 2.76 which is 96% of predicted value, FEV1 2.36 which is 105% of predicted value.  Spirometry indicates normal ventilatory function.  Assessment and Plan: 1. Not well controlled moderate persistent asthma   2. Gastroesophageal reflux disease, unspecified whether esophagitis present   3. Seasonal and perennial allergic rhinitis   4. Intrinsic atopic dermatitis     Meds ordered this encounter  Medications   fluticasone -salmeterol (ADVAIR) 250-50 MCG/ACT AEPB    Sig: Inhale  1 puff into the lungs in the morning and at bedtime.    Dispense:  1 each    Refill:  3    Patient Instructions  Asthma Begin Advair 250-1 puff once a day to prevent cough or wheeze Continue albuterol  2 puffs once every 4 hours as needed for cough or wheeze You may use albuterol  2 puffs 5 to 15 minutes before activity to decrease cough or wheeze  Allergic rhinitis Continue allergen avoidance measures directed toward grass pollen, weed pollen, ragweed pollen, tree pollen, mold, dust mite, cat, dog, horse, feather, and cockroach as listed below Continue Flonase  2 sprays in each nostril once a day as needed for stuffy nose Continue azelastine  2 sprays in each nostril up to twice a day for nasal symptoms Continue levocetirizine 5 mg once a day as needed for runny nose. Remember to rotate to a different antihistamine about every 3 months. Some examples of over the counter antihistamines include Zyrtec  (cetirizine ), Xyzal  (levocetirizine), Allegra (fexofenadine), and Claritin (loratidine).  Consider allergen immunotherapy if your symptoms are not well-controlled with the treatment plan as listed above. Written information provided  Atopic dermatitis Continue a twice a day moisturizing routine Continue tacrolimus  to red and itchy areas up to twice a day as needed Continue Eucrisa  to red and itchy areas up to twice a day as needed  Reflux Continue dietary lifestyle modifications as listed below Continue famotidine  20 mg twice a day to prevent reflux.  Continue esomeprazole daily  Call the clinic if this treatment plan is not working well for you.  Follow up in 3 months or sooner if needed.   Return in about 3 months (around 07/24/2024), or if symptoms worsen or fail to improve.    Thank you for the opportunity to care for this patient.  Please do not hesitate to contact me with questions.  Marinus Sic, FNP [facility] 

## 2024-04-22 NOTE — Progress Notes (Unsigned)
 Chief Complaint  Patient presents with   Nail Problem    "A piece of my toenail fell off." N - toenail  L - hallux left D - 2 years O - gradually worse C - discolored, breaking off A - nail polish T - none     HPI: Patient is 68 y.o. female who presents today for broken left great teonail.  She relates the nail has gotten discolored and brittle.    Patient Active Problem List   Diagnosis Date Noted   Seasonal and perennial allergic rhinitis 06/10/2023   Moderate persistent asthma without complication 02/10/2021   Seasonal and perennial allergic rhinoconjunctivitis 02/10/2021   Dyshidrotic eczema 12/12/2020   Heartburn 12/12/2020   HYPERCHOLESTEROLEMIA 01/20/2008   DEPRESSION 01/20/2008   Essential hypertension 01/20/2008   Diaphragmatic hernia 01/20/2008   DIARRHEA 01/20/2008   GASTROESOPHAGEAL REFLUX DISEASE 10/20/2007    Current Outpatient Medications on File Prior to Visit  Medication Sig Dispense Refill   albuterol  (VENTOLIN  HFA) 108 (90 Base) MCG/ACT inhaler INHALE 2 PUFFS INTO LUNGS EVERY 4-6 HOURS AS NEEDED FOR WHEEZING OR SHORTNESS OF BREATH (COUGHING) 6.7 each 0   Black Cohosh 540 MG CAPS See admin instructions.     buPROPion (WELLBUTRIN XL) 150 MG 24 hr tablet Take 450 mg by mouth daily.     Calcium Carbonate-Vit D-Min (CALCIUM 1200 PO)      Cholecalciferol (VITAMIN D3 PO)      Crisaborole  (EUCRISA ) 2 % OINT Apply twice daily as needed for flares 100 g 2   Esomeprazole Magnesium (NEXIUM PO) Take by mouth.     famotidine  (PEPCID ) 20 MG tablet TAKE 1 TABLET BY MOUTH TWICE A DAY 180 tablet 1   fluticasone  (FLONASE ) 50 MCG/ACT nasal spray Place 2 sprays into both nostrils daily. 16 g 5   fluticasone -salmeterol (ADVAIR) 100-50 MCG/ACT AEPB Inhale 1 puff into the lungs 2 (two) times daily.     fluticasone -salmeterol (ADVAIR) 100-50 MCG/ACT AEPB Inhale 1 puff into the lungs 2 (two) times daily. 60 each 5   levocetirizine (XYZAL ) 5 MG tablet Take 1 tablet (5 mg total)  by mouth every evening. 30 tablet 5   LORazepam (ATIVAN) 0.5 MG tablet Take 0.5 mg by mouth 2 (two) times daily as needed.     montelukast  (SINGULAIR ) 10 MG tablet Take 10 mg by mouth at bedtime.     Multiple Vitamin (MULTIVITAMINS PO) 1 po     olmesartan (BENICAR) 20 MG tablet Take 20 mg by mouth daily.     simvastatin (ZOCOR) 20 MG tablet Take 20 mg by mouth at bedtime.     tacrolimus  (PROTOPIC ) 0.03 % ointment Apply topically 2 (two) times daily as needed. 100 g 5   valACYclovir (VALTREX) 1000 MG tablet 2 tablets     White Petrolatum-Mineral Oil (SYSTANE NIGHTTIME) OINT See admin instructions.     Azelastine  HCl 0.15 % SOLN Place 1-2 sprays into the nose 2 (two) times daily as needed (drainage). (Patient not taking: Reported on 04/22/2024) 30 mL 5   dupilumab  (DUPIXENT ) 300 MG/2ML prefilled syringe Inject 300 mg into the skin every 14 (fourteen) days. (Patient not taking: Reported on 04/22/2024) 4 mL 11   Fluticasone -Umeclidin-Vilant (TRELEGY ELLIPTA ) 200-62.5-25 MCG/ACT AEPB Inhale 1 puff into the lungs daily. (Patient not taking: Reported on 04/22/2024) 60 each 5   lithium  300 MG tablet 1 a day for a week, then 2/day. Get lab after on 2/day for a week (Patient not taking: Reported on 04/22/2024) 60 tablet 0  VYVANSE 30 MG capsule Take 30 mg by mouth at bedtime. (Patient not taking: Reported on 04/22/2024)     XIIDRA 5 % SOLN  (Patient not taking: Reported on 04/22/2024)     No current facility-administered medications on file prior to visit.    Allergies  Allergen Reactions   Omeprazole Other (See Comments)   Zantac [Ranitidine] Other (See Comments)    Review of Systems No fevers, chills, nausea, muscle aches, no difficulty breathing, no calf pain, no chest pain or shortness of breath.   Physical Exam  GENERAL APPEARANCE: Alert, conversant. Appropriately groomed. No acute distress.   VASCULAR: Pedal pulses palpable 2/4 DP and  PT bilateral.  Capillary refill time is immediate to all  digits,  Proximal to distal cooling is warm to warm.  Digital perfusion adequate.   NEUROLOGIC: sensation is intact to 5.07 monofilament at 5/5 sites bilateral.  Light touch is intact bilateral, vibratory sensation intact bilateral  MUSCULOSKELETAL: acceptable muscle strength, tone and stability bilateral.  No gross boney pedal deformities noted.  No pain, crepitus or limitation noted with foot and ankle range of motion bilateral.   DERMATOLOGIC:left hallux nail is broken off and there is discoloation of the right 2nd and third toenails-  possibly fungal.     Assessment     ICD-10-CM   1. 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  Recommended topical nail treatment from [employer].  I will fax in a prescription.  She will call if this fails to improve the nails.

## 2024-04-23 ENCOUNTER — Encounter: Payer: Self-pay | Admitting: Family Medicine

## 2024-04-23 ENCOUNTER — Other Ambulatory Visit: Payer: Self-pay

## 2024-04-23 ENCOUNTER — Ambulatory Visit: Payer: Medicare PPO | Admitting: Family Medicine

## 2024-04-23 VITALS — BP 156/82 | HR 82 | Temp 98.3°F | Wt 178.8 lb

## 2024-04-23 DIAGNOSIS — J302 Other seasonal allergic rhinitis: Secondary | ICD-10-CM | POA: Diagnosis not present

## 2024-04-23 DIAGNOSIS — K219 Gastro-esophageal reflux disease without esophagitis: Secondary | ICD-10-CM | POA: Diagnosis not present

## 2024-04-23 DIAGNOSIS — J3089 Other allergic rhinitis: Secondary | ICD-10-CM

## 2024-04-23 DIAGNOSIS — L2084 Intrinsic (allergic) eczema: Secondary | ICD-10-CM | POA: Diagnosis not present

## 2024-04-23 DIAGNOSIS — J4540 Moderate persistent asthma, uncomplicated: Secondary | ICD-10-CM | POA: Diagnosis not present

## 2024-04-23 MED ORDER — FLUTICASONE-SALMETEROL 250-50 MCG/ACT IN AEPB: 1.0000 | INHALATION_SPRAY | Freq: Two times a day (BID) | RESPIRATORY_TRACT | 3 refills | Status: DC

## 2024-04-23 NOTE — Addendum Note (Signed)
 Addended by: Norville Beery, Elleana Stillson on: 04/23/2024 01:20 PM   Modules accepted: Orders

## 2024-05-02 ENCOUNTER — Other Ambulatory Visit: Payer: Self-pay | Admitting: Family Medicine

## 2024-07-23 ENCOUNTER — Other Ambulatory Visit: Payer: Self-pay | Admitting: Family Medicine

## 2024-07-24 ENCOUNTER — Ambulatory Visit: Admitting: Family Medicine

## 2024-07-24 ENCOUNTER — Ambulatory Visit: Admitting: Allergy

## 2024-07-30 ENCOUNTER — Other Ambulatory Visit: Payer: Self-pay

## 2024-07-30 ENCOUNTER — Ambulatory Visit: Admitting: Allergy

## 2024-07-30 ENCOUNTER — Encounter: Payer: Self-pay | Admitting: Allergy

## 2024-07-30 VITALS — BP 142/80 | HR 90 | Temp 98.2°F | Resp 16 | Ht 63.75 in | Wt 175.3 lb

## 2024-07-30 DIAGNOSIS — L2084 Intrinsic (allergic) eczema: Secondary | ICD-10-CM

## 2024-07-30 DIAGNOSIS — J3089 Other allergic rhinitis: Secondary | ICD-10-CM

## 2024-07-30 DIAGNOSIS — K219 Gastro-esophageal reflux disease without esophagitis: Secondary | ICD-10-CM

## 2024-07-30 DIAGNOSIS — J302 Other seasonal allergic rhinitis: Secondary | ICD-10-CM

## 2024-07-30 DIAGNOSIS — J4540 Moderate persistent asthma, uncomplicated: Secondary | ICD-10-CM | POA: Diagnosis not present

## 2024-07-30 MED ORDER — FLUTICASONE-SALMETEROL 250-50 MCG/ACT IN AEPB: 1.0000 | INHALATION_SPRAY | Freq: Two times a day (BID) | RESPIRATORY_TRACT | 5 refills | Status: AC

## 2024-07-30 MED ORDER — ALBUTEROL SULFATE HFA 108 (90 BASE) MCG/ACT IN AERS: 2.0000 | INHALATION_SPRAY | RESPIRATORY_TRACT | 1 refills | Status: DC | PRN

## 2024-07-30 MED ORDER — FLUTICASONE PROPIONATE 50 MCG/ACT NA SUSP: 2.0000 | Freq: Every day | NASAL | 5 refills | Status: AC

## 2024-07-30 MED ORDER — LEVOCETIRIZINE DIHYDROCHLORIDE 5 MG PO TABS: 5.0000 mg | ORAL_TABLET | Freq: Every evening | ORAL | 5 refills | Status: AC

## 2024-07-30 MED ORDER — FAMOTIDINE 20 MG PO TABS: 20.0000 mg | ORAL_TABLET | Freq: Two times a day (BID) | ORAL | 5 refills | Status: AC

## 2024-07-30 NOTE — Addendum Note (Signed)
 Addended by: DELPHIA ROSALINE NOVAK on: 07/30/2024 06:32 PM   Modules accepted: Orders

## 2024-07-30 NOTE — Progress Notes (Signed)
 Follow-up Note  RE: [name] [name] MRN: [id number] DOB: 1956/03/24 Date of Office Visit: 07/30/2024   History of present illness: [name] [name] is a 68 y.o. female presenting today for follow-up of asthma, allergic rhinitis, atopic dermatitis, reflux. She was last seen in the office on 04/23/24 by our nurse practitioner Ambs.  Discussed the use of AI scribe software for clinical note transcription with the patient, who gave verbal consent to proceed.  She has used her rescue inhaler only two or three times in the past three months with effective relief. She recently joined a class at the [facility] but found it difficult to keep up and she experienced difficulty lying flat where she developed vertigo.   She experiences dry and itchy eyes, sneezing, and nasal stuffiness. She uses Flonase  for nasal stuffiness, performs nasal rinses as needed, and takes Xyzal  as her regular antihistamine. She has not used azelastine  nasal spray recently.  She discontinued Dupixent  due to cost and has not noticed significant changes in her skin or breathing since stopping it.  For her skin, she uses Eucrisa  ointment, which she finds effective, and has not had significant flare-ups recently.  She continues to take Nexium and famotidine  for reflux, which she finds effective.  Review of systems: 10pt ROS negative unless noted above in HPI   Past medical/social/surgical/family history have been reviewed and are unchanged unless specifically indicated below.  No changes  Medication List: Current Outpatient Medications  Medication Sig Dispense Refill   albuterol  (VENTOLIN  HFA) 108 (90 Base) MCG/ACT inhaler INHALE 2 PUFFS INTO LUNGS EVERY 4-6 HOURS AS NEEDED FOR WHEEZING OR SHORTNESS OF BREATH (COUGHING) 6.7 each 0   Black Cohosh 540 MG CAPS See admin instructions.     buPROPion (WELLBUTRIN XL) 150 MG 24 hr tablet Take 450 mg by mouth daily.     Calcium Carbonate-Vit D-Min (CALCIUM 1200 PO)       Cholecalciferol (VITAMIN D3 PO)      Crisaborole  (EUCRISA ) 2 % OINT Apply twice daily as needed for flares 100 g 2   Esomeprazole Magnesium (NEXIUM PO) Take by mouth.     famotidine  (PEPCID ) 20 MG tablet TAKE 1 TABLET BY MOUTH TWICE A DAY 180 tablet 1   fluticasone  (FLONASE ) 50 MCG/ACT nasal spray Place 2 sprays into both nostrils daily. 16 g 5   fluticasone -salmeterol (ADVAIR) 250-50 MCG/ACT AEPB Inhale 1 puff into the lungs in the morning and at bedtime. 1 each 3   levocetirizine (XYZAL ) 5 MG tablet TAKE 1 TABLET BY MOUTH EVERY DAY IN THE EVENING 90 tablet 0   LORazepam (ATIVAN) 0.5 MG tablet Take 0.5 mg by mouth 2 (two) times daily as needed.     montelukast  (SINGULAIR ) 10 MG tablet Take 10 mg by mouth at bedtime.     Multiple Vitamin (MULTIVITAMINS PO) 1 po     olmesartan (BENICAR) 20 MG tablet Take 20 mg by mouth daily.     simvastatin (ZOCOR) 20 MG tablet Take 20 mg by mouth at bedtime.     tacrolimus  (PROTOPIC ) 0.03 % ointment Apply topically 2 (two) times daily as needed. 100 g 5   valACYclovir (VALTREX) 1000 MG tablet 2 tablets     White Petrolatum-Mineral Oil (SYSTANE NIGHTTIME) OINT See admin instructions.     Azelastine  HCl 0.15 % SOLN Place 1-2 sprays into the nose 2 (two) times daily as needed (drainage). (Patient not taking: Reported on 07/30/2024) 30 mL 5   dupilumab  (DUPIXENT ) 300 MG/2ML prefilled syringe Inject 300 mg  into the skin every 14 (fourteen) days. (Patient not taking: Reported on 07/30/2024) 4 mL 11   lithium  300 MG tablet 1 a day for a week, then 2/day. Get lab after on 2/day for a week (Patient not taking: Reported on 07/30/2024) 60 tablet 0   VYVANSE 30 MG capsule Take 30 mg by mouth at bedtime. (Patient not taking: Reported on 07/30/2024)     XIIDRA 5 % SOLN  (Patient not taking: Reported on 07/30/2024)     No current facility-administered medications for this visit.     Known medication allergies: Allergies  Allergen Reactions   Omeprazole Other (See Comments)    Zantac [Ranitidine] Other (See Comments)     Physical examination: Blood pressure (!) 142/80, pulse 90, temperature 98.2 F (36.8 C), temperature source Temporal, resp. rate 16, height 5' 3.75 (1.619 m), weight 175 lb 4.8 oz (79.5 kg), SpO2 95%.  General: Alert, interactive, in no acute distress. HEENT: PERRLA, TMs pearly gray, turbinates minimally edematous without discharge, post-pharynx non erythematous. Neck: Supple without lymphadenopathy. Lungs: Clear to auscultation without wheezing, rhonchi or rales. {no increased work of breathing. CV: Normal S1, S2 without murmurs. Abdomen: Nondistended, nontender. Skin: Warm and dry, without lesions or rashes. Extremities:  No clubbing, cyanosis or edema. Neuro:   Grossly intact.  Diagnostics/Labs: Spirometry: FEV1: 2.25L 100%, FVC: 2.7L 94%, ratio consistent with nonobstructive pattern  Assessment and plan: Asthma Continue Advair 250-1 puff twice a day to prevent cough or wheeze.   Rinse mouth/brush teeth after use.  Continue albuterol  2 puffs once every 4 hours as needed for cough or wheeze You may use albuterol  2 puffs 5 to 15 minutes before activity to decrease cough or wheeze  Allergic rhinitis Continue allergen avoidance measures directed toward grass pollen, weed pollen, ragweed pollen, tree pollen, mold, dust mite, cat, dog, horse, feather, and cockroach Continue Flonase  2 sprays in each nostril once a day as needed for stuffy nose Continue levocetirizine 5 mg once a day.  Can try taking it a different time of day to see if helps with dry mouth symptoms Consider allergen immunotherapy if your symptoms are not well-controlled with the treatment plan as listed above. Written information provided  Atopic dermatitis Continue a twice a day moisturizing routine Continue Eucrisa  to red and itchy areas up to twice a day as needed  Reflux Continue dietary lifestyle modifications as listed below Continue famotidine  20 mg twice a  day to prevent reflux.  Continue esomeprazole daily  Call the clinic if this treatment plan is not working well for you.  Follow up in 6 months or sooner if needed.  I appreciate the opportunity to take part in [name] [name]'s care. Please do not hesitate to contact me with questions.  Sincerely,   Danita Brain, MD Allergy /Immunology [facility]

## 2024-07-30 NOTE — Patient Instructions (Addendum)
 Asthma Continue Advair 250-1 puff twice a day to prevent cough or wheeze.   Rinse mouth/brush teeth after use.  Continue albuterol  2 puffs once every 4 hours as needed for cough or wheeze You may use albuterol  2 puffs 5 to 15 minutes before activity to decrease cough or wheeze  Allergic rhinitis Continue allergen avoidance measures directed toward grass pollen, weed pollen, ragweed pollen, tree pollen, mold, dust mite, cat, dog, horse, feather, and cockroach Continue Flonase  2 sprays in each nostril once a day as needed for stuffy nose Continue levocetirizine 5 mg once a day.  Can try taking it a different time of day to see if helps with dry mouth symptoms Consider allergen immunotherapy if your symptoms are not well-controlled with the treatment plan as listed above. Written information provided  Atopic dermatitis Continue a twice a day moisturizing routine Continue Eucrisa  to red and itchy areas up to twice a day as needed  Reflux Continue dietary lifestyle modifications as listed below Continue famotidine  20 mg twice a day to prevent reflux.  Continue esomeprazole daily  Call the clinic if this treatment plan is not working well for you.  Follow up in 6 months or sooner if needed.

## 2024-08-31 DIAGNOSIS — R7303 Prediabetes: Secondary | ICD-10-CM | POA: Diagnosis not present

## 2024-08-31 DIAGNOSIS — Z6830 Body mass index (BMI) 30.0-30.9, adult: Secondary | ICD-10-CM | POA: Diagnosis not present

## 2024-08-31 DIAGNOSIS — E785 Hyperlipidemia, unspecified: Secondary | ICD-10-CM | POA: Diagnosis not present

## 2024-08-31 DIAGNOSIS — F325 Major depressive disorder, single episode, in full remission: Secondary | ICD-10-CM | POA: Diagnosis not present

## 2024-08-31 DIAGNOSIS — E669 Obesity, unspecified: Secondary | ICD-10-CM | POA: Diagnosis not present

## 2024-08-31 DIAGNOSIS — R42 Dizziness and giddiness: Secondary | ICD-10-CM | POA: Diagnosis not present

## 2024-08-31 DIAGNOSIS — Z1231 Encounter for screening mammogram for malignant neoplasm of breast: Secondary | ICD-10-CM | POA: Diagnosis not present

## 2024-08-31 DIAGNOSIS — I10 Essential (primary) hypertension: Secondary | ICD-10-CM | POA: Diagnosis not present

## 2024-09-15 DIAGNOSIS — B079 Viral wart, unspecified: Secondary | ICD-10-CM | POA: Diagnosis not present

## 2024-09-25 ENCOUNTER — Other Ambulatory Visit: Payer: Self-pay | Admitting: Family Medicine

## 2024-10-23 ENCOUNTER — Ambulatory Visit: Attending: Family Medicine | Admitting: Physical Therapy

## 2024-10-23 ENCOUNTER — Other Ambulatory Visit: Payer: Self-pay

## 2024-10-23 ENCOUNTER — Encounter: Payer: Self-pay | Admitting: Physical Therapy

## 2024-10-23 DIAGNOSIS — R42 Dizziness and giddiness: Secondary | ICD-10-CM | POA: Insufficient documentation

## 2024-10-23 DIAGNOSIS — H8111 Benign paroxysmal vertigo, right ear: Secondary | ICD-10-CM | POA: Diagnosis not present

## 2024-10-23 NOTE — Therapy (Signed)
 OUTPATIENT PHYSICAL THERAPY VESTIBULAR EVALUATION     Patient Name: [name] [name] MRN: [id number] DOB:14-May-1956, 68 y.o., female Today's Date: 10/24/2024  END OF SESSION:  PT End of Session - 10/23/24 0807     Visit Number 1    Number of Visits 9    Date for Recertification  11/20/24    Authorization Type [employer]-auth submitted    PT Start Time 0807   late arrival   PT Stop Time 0858    PT Time Calculation (min) 51 min    Activity Tolerance Other (comment)   nausea, vomiting after Epley   Behavior During Therapy [facility] for tasks assessed/performed          Past Medical History:  Diagnosis Date   Asthma    Eczema    Recurrent upper respiratory infection (URI)    Past Surgical History:  Procedure Laterality Date   BREAST EXCISIONAL BIOPSY Right 2000?   Pt states exc bx, benign, for milk duct, unsure which side, cannot see scar   SINOSCOPY     Patient Active Problem List   Diagnosis Date Noted   Seasonal and perennial allergic rhinitis 06/10/2023   Moderate persistent asthma without complication 02/10/2021   Seasonal and perennial allergic rhinoconjunctivitis 02/10/2021   Dyshidrotic eczema 12/12/2020   Heartburn 12/12/2020   HYPERCHOLESTEROLEMIA 01/20/2008   DEPRESSION 01/20/2008   Essential hypertension 01/20/2008   Diaphragmatic hernia 01/20/2008   DIARRHEA 01/20/2008   GASTROESOPHAGEAL REFLUX DISEASE 10/20/2007    PCP: Cleotilde Planas, MD  REFERRING PROVIDER: Cleotilde Planas, MD   REFERRING DIAG: R42 (ICD-10-CM) - Vertigo   THERAPY DIAG:  Dizziness and giddiness  BPPV (benign paroxysmal positional vertigo), right  ONSET DATE: 09/01/2024 MD referral  Rationale for Evaluation and Treatment: Rehabilitation  SUBJECTIVE:   SUBJECTIVE STATEMENT: Have had car sickness my entire life.  Bump into things all the time.  Get dizzy often with turning head fast.  Talked with my sister who had vertigo and realized this may be vertigo.  Did have spinning  when I lay down on the floor for an exercise class a couple of months ago.  Did have vertigo and was treated for this years ago (>25 years ago) Pt accompanied by: self  PERTINENT HISTORY: depression, obesity, HLD, essential HTN, ADHD, hx of vertigo (>25 years ago)  PAIN:  Are you having pain? No  PRECAUTIONS: None  RED FLAGS: None   WEIGHT BEARING RESTRICTIONS: No  FALLS: Has patient fallen in last 6 months? No  LIVING ENVIRONMENT: Lives with: lives with their spouse   PLOF: Independent  PATIENT GOALS: To figure out about this dizziness/vertigo  OBJECTIVE:  Note: Objective measures were completed at Evaluation unless otherwise noted.  DIAGNOSTIC FINDINGS: NA for this episode  COGNITION: Overall cognitive status: Within functional limits for tasks assessed   SENSATION: Not tested  POSTURE:  rounded shoulders, forward head, and tends to hold head neck in slightly upward tipped position in extension  Cervical ROM:    Active A/PROM (deg) eval  Flexion 32  Extension 20  Right lateral flexion   Left lateral flexion   Right rotation 45  Left rotation 45  (Blank rows = not tested)   BED MOBILITY:  Independent  TRANSFERS: Assistive device utilized: None  Sit to stand: Modified independence Stand to sit: Modified independence   GAIT: Gait pattern: guarded after Epley manuever and WFL  Comments: Did not formally assess gait   PATIENT SURVEYS:  DHI: THE DIZZINESS HANDICAP INVENTORY (DHI)  P1.  Does looking up increase your problem? 4 = Yes  E2. Because of your problem, do you feel frustrated? 4 = Yes  F3. Because of your problem, do you restrict your travel for business or recreation?  4 = Yes  P4. Does walking down the aisle of a supermarket increase your problems?  2 = Sometimes  F5. Because of your problem, do you have difficulty getting into or out of bed?  4 = Yes  F6. Does your problem significantly restrict your participation in social activities,  such as going out to dinner, going to the movies, dancing, or going to parties? 4 = Yes  F7. Because of your problem, do you have difficulty reading?  0 = No  P8. Does performing more ambitious activities such as sports, dancing, household chores (sweeping or putting dishes away) increase your problems?  2 = Sometimes  E9. Because of your problem, are you afraid to leave your home without having without having someone accompany you?  0 = No  E10. Because of your problem have you been embarrassed in front of others?  0 = No  P11. Do quick movements of your head increase your problem?  4 = Yes  F12. Because of your problem, do you avoid heights?  2 = Sometimes  P13. Does turning over in bed increase your problem?  2 = Sometimes  F14. Because of your problem, is it difficult for you to do strenuous homework or yard work? 4 = Yes  E15. Because of your problem, are you afraid people may think you are intoxicated? 0 = No  F16. Because of your problem, is it difficult for you to go for a walk by yourself?  0 = No  P17. Does walking down a sidewalk increase your problem?  2 = Sometimes  E18.Because of your problem, is it difficult for you to concentrate 2 = Sometimes  F19. Because of your problem, is it difficult for you to walk around your house in the dark? 4 = Yes  E20. Because of your problem, are you afraid to stay home alone?  0 = No  E21. Because of your problem, do you feel handicapped? 0 = No  E22. Has the problem placed stress on your relationships with members of your family or friends? 4 = Yes  E23. Because of your problem, are you depressed?  4 = Yes  F24. Does your problem interfere with your job or household responsibilities?  2 = Sometimes  P25. Does bending over increase your problem?  4 = Yes  TOTAL 58    DHI Scoring Instructions  The patient is asked to answer each question as it pertains to dizziness or unsteadiness problems, specifically  considering their condition during the  last month. Questions are designed to incorporate functional (F), physical  (P), and emotional (E) impacts on disability.   Scores greater than 10 points should be referred to balance specialists for further evaluation.   16-34 Points (mild handicap)  36-52 Points (moderate handicap)  54+ Points (severe handicap)  Minimally Detectable Change: 17 points ([address] [place], 1990)  [place], G. SHAUNNA. and [place], C. W. (1990). The development of the Dizziness Handicap Inventory. Archives of Otolaryngology - Head and Neck Surgery 116(4): [id number].   VESTIBULAR ASSESSMENT:  GENERAL OBSERVATION: No acute distress, wearing glasses (bifocals)   SYMPTOM BEHAVIOR:  Subjective history: She reports having a longstanding hx of dizziness and bumping into things; has always had history of motion sickness.  Several months ago,  went to lie down on floor at exercise class and she had intensity spinning, dizziness, nausea.  Hx of migraines, motion sickness; no hx of falls.  Of note, pt sleeps reclined due to digestive issues  Non-Vestibular symptoms: neck pain, headaches, nausea/vomiting, and migraine symptoms  Type of dizziness: Imbalance (Disequilibrium), Spinning/Vertigo, and Unsteady with head/body turns  Frequency: 1 x in the past several months (avoids positions that brings it on)  Duration: minutes  Aggravating factors: Induced by position change: lying supine, rolling to the right, rolling to the left, and supine to sit and Induced by motion: looking up at the ceiling, turning body quickly, and turning head quickly  Relieving factors: head stationary, lying supine, slow movements, and avoiding movements that bring on symptoms  Progression of symptoms: unchanged  OCULOMOTOR EXAM: wears bifocals  Ocular Alignment: R eye hypermetria   Ocular ROM: No Limitations  Spontaneous Nystagmus: absent  Gaze-Induced Nystagmus: absent  Smooth Pursuits: slowed, some saccadic intrusions  Saccades: some  overshooting in posterior direction  Convergence/Divergence: 2 cm   Cover-cross-cover test: NT   VESTIBULAR - OCULAR REFLEX:   Slow VOR: Comment: slowed and mild symptoms  VOR Cancellation: Comment: increased nausea (motion sensitive)  Head-Impulse Test: HIT Right: negative HIT Left: negative  Dynamic Visual Acuity: NT   POSITIONAL TESTING: Right Dix-Hallpike: upbeating, right nystagmus, rotational, latent onset, and Duration: 20 sec Left Dix-Hallpike: NT  Right Roll Test: no nystagmus Left Roll Test: no nystagmus    M-CTSIB  Condition 1: Firm Surface, EO 30 Sec, Normal Sway  Condition 2: Firm Surface, EC 30 Sec, Mild Sway  Condition 3: Foam Surface, EO NT Sec, NT Sway  Condition 4: Foam Surface, EC NT Sec, NT Sway    MOTION SENSITIVITY:  NOT TESTED AT EVAL  Motion Sensitivity Quotient Intensity: 0 = none, 1 = Lightheaded, 2 = Mild, 3 = Moderate, 4 = Severe, 5 = Vomiting  Intensity  1. Sitting to supine   2. Supine to L side   3. Supine to R side   4. Supine to sitting   5. L Hallpike-Dix   6. Up from L    7. R Hallpike-Dix   8. Up from R    9. Sitting, head tipped to L knee   10. Head up from L knee   11. Sitting, head tipped to R knee   12. Head up from R knee   13. Sitting head turns x5   14.Sitting head nods x5   15. In stance, 180 turn to L    16. In stance, 180 turn to R     OTHOSTATICS: not done                                                                                                                              TREATMENT DATE: 10/23/2024   Canalith Repositioning:  Epley Right: Number of Reps: 1, Response to Treatment: comment: tolerated treatment maneuever well, and Comment:  nauseated, vomiting at end of session  *She reports wanting to get to fresh air and she did have episode of emesis upon walking out of lobby at end of session.  PT assisted pt to her car. PATIENT EDUCATION: Education details: Eval results, POC; rationale for BPPV  treatment, post CRM expectations Person educated: Patient Education method: Explanation Education comprehension: verbalized understanding  HOME EXERCISE PROGRAM:  GOALS: Goals reviewed with patient? Yes  SHORT TERM GOALS: = LTGs  LONG TERM GOALS: Target date: 11/20/2024  Pt will be independent with HEP for improved dizziness and balance. Baseline:  Goal status: INITIAL  2.  DHI score to improve to less than or equal to 36 for improved dizziness impacting daily activities. Baseline: 54 Goal status: INITIAL  3.  Pt will report 0/10 dizziness with bed mobility. Baseline:  Goal status: INITIAL  4.  FGA to be assessed, with goal to improve to at least 22/30 for decreased fall risk. Baseline: TBD Goal status: INITIAL   ASSESSMENT:  CLINICAL IMPRESSION: Patient is a 68 y.o. female who was seen today for physical therapy evaluation and treatment for vertigo.  She reports longstanding history of dizziness, bumping into things, and motion sickness; she has hx of migraines, neck pain, and she sleeps in reclined/propped position due to digestive issues.  She reports episode about 2 months ago, where she went to lie down on exercise mat for exercise class and began to have intense spinning, nausea, which lasted minutes; once this episode was over, she had to be helped up and moved slowly and felt bad the rest of the day.  With oculomotor testing today, she does have some saccadic eye movements and R eye hypermetria; she also has slowed VOR and increase of symptoms come on with VOR and with VOR cancellation.  She has mod sway on Condition 4 of MCTSIB.  With positional testing for BPPV, she is positive for R posterior canal BPPV (negative for R and L roll test; unable to test for L posterior canal, as pt feels increasingly symptomatic and bad upon sitting up from initial testing).  Pt is agreeable to completing Epley maneuver to R side, which she tolerated well; however, upon completion, pt  reports increased feeling of heat, nausea, and had emesis episode.  Pt would benefit from further PT to address dizziness, vertigo, and likely decreased vestibular system use for balance due to hx of migraines, vertigo, and motion sensitivity.    OBJECTIVE IMPAIRMENTS: decreased balance, dizziness, impaired flexibility, and postural dysfunction.   ACTIVITY LIMITATIONS: bending, bed mobility, reach over head, and locomotion level  PARTICIPATION LIMITATIONS: meal prep, cleaning, laundry, driving, shopping, and community activity  PERSONAL FACTORS: 3+ comorbidities: see above are also affecting patient's functional outcome.   REHAB POTENTIAL: Good  CLINICAL DECISION MAKING: Stable/uncomplicated  EVALUATION COMPLEXITY: Low   PLAN:  PT FREQUENCY: 1-2x/week  PT DURATION: 4 weeks plus eval  PLANNED INTERVENTIONS: 97750- Physical Performance Testing, 97110-Therapeutic exercises, 97530- Therapeutic activity, [id number]- Neuromuscular re-education, 97535- Self Care, [id number]- Manual therapy, [id number]- Canalith repositioning, Patient/Family education, Balance training, and Vestibular training  PLAN FOR NEXT SESSION: Reassess for positional vertigo (R and L) and treat as needed; provide habituation, VOR exercises as pt tolerates.  Assess FGA   STARLET GREIG ORN., PT 10/24/2024, 2:18 PM   [facility] [address] [place], [place] [address] Phone # [phone] Fax # [phone]  Referring diagnosis:  R42 (ICD-10-CM) - Vertigo  Treatment diagnosis (if different  than referring diagnosis): R 42, H81.11 Date Symptoms Began: September 2025 # of Visits requested: 9  Time period for Authorization: 10/23/2024 to 11/20/2024  What was this (referring dx) caused by? []  Surgery []  Fall [x]  Ongoing issue []  Arthritis []  Other: ____________  Laterality: [x]  Rt []  Lt []  Both  Functional Tool & Score: + R Dix Hallpike maneuver indicating R  posterior canal BPPV; DHI score 54  Check all possible CPT codes:     See Planned Interventions listed in the Plan section of the Evaluation.     If [employer]: Choose 10 or less codes  If [employer]: Modalities are not covered  If [employer]: Check allowed ICD code combinations   If [facility] Plan or [employer]: Cognitive training not covered

## 2024-11-05 ENCOUNTER — Ambulatory Visit: Attending: Family Medicine | Admitting: Physical Therapy

## 2024-11-05 ENCOUNTER — Encounter: Payer: Self-pay | Admitting: Physical Therapy

## 2024-11-05 DIAGNOSIS — R42 Dizziness and giddiness: Secondary | ICD-10-CM | POA: Insufficient documentation

## 2024-11-05 DIAGNOSIS — H8111 Benign paroxysmal vertigo, right ear: Secondary | ICD-10-CM | POA: Diagnosis present

## 2024-11-05 NOTE — Therapy (Signed)
 OUTPATIENT PHYSICAL THERAPY VESTIBULAR TREATMENT NOTE     Patient Name: [name] [name] MRN: [id number] DOB:06-15-1956, 68 y.o., female Today's Date: 11/05/2024  END OF SESSION:  PT End of Session - 11/05/24 1317     Visit Number 2    Number of Visits 9    Date for Recertification  11/20/24    Authorization Type [employer]    Authorization Time Period 10/23/2024-11/20/2024    Authorization - Visit Number 2    Authorization - Number of Visits 9    PT Start Time 1320    PT Stop Time 1400    PT Time Calculation (min) 40 min    Activity Tolerance Patient tolerated treatment well    Behavior During Therapy WFL for tasks assessed/performed           Past Medical History:  Diagnosis Date   Asthma    Eczema    Recurrent upper respiratory infection (URI)    Past Surgical History:  Procedure Laterality Date   BREAST EXCISIONAL BIOPSY Right 2000?   Pt states exc bx, benign, for milk duct, unsure which side, cannot see scar   SINOSCOPY     Patient Active Problem List   Diagnosis Date Noted   Seasonal and perennial allergic rhinitis 06/10/2023   Moderate persistent asthma without complication 02/10/2021   Seasonal and perennial allergic rhinoconjunctivitis 02/10/2021   Dyshidrotic eczema 12/12/2020   Heartburn 12/12/2020   HYPERCHOLESTEROLEMIA 01/20/2008   DEPRESSION 01/20/2008   Essential hypertension 01/20/2008   Diaphragmatic hernia 01/20/2008   DIARRHEA 01/20/2008   GASTROESOPHAGEAL REFLUX DISEASE 10/20/2007    PCP: Cleotilde Planas, MD  REFERRING PROVIDER: Cleotilde Planas, MD   REFERRING DIAG: R42 (ICD-10-CM) - Vertigo   THERAPY DIAG:  Dizziness and giddiness  BPPV (benign paroxysmal positional vertigo), right  ONSET DATE: 09/01/2024 MD referral  Rationale for Evaluation and Treatment: Rehabilitation  SUBJECTIVE:   SUBJECTIVE STATEMENT: Was sick for 2 days after last visit and had some back pain due to the amount of throwing up that I did.  Pt  accompanied by: self  PERTINENT HISTORY: depression, obesity, HLD, essential HTN, ADHD, hx of vertigo (>25 years ago)  PAIN:  Are you having pain? No  PRECAUTIONS: None  RED FLAGS: None   WEIGHT BEARING RESTRICTIONS: No  FALLS: Has patient fallen in last 6 months? No  LIVING ENVIRONMENT: Lives with: lives with their spouse   PLOF: Independent  PATIENT GOALS: To figure out about this dizziness/vertigo  OBJECTIVE:   I took a dramamine earlier today.   TODAY'S TREATMENT: 11/05/2024 Activity Comments  R Dix Hallpike Latent response with mild upbeating nystagmus x 10 sec  R Epley Tolerates well   R Brandt-Daroff x 1 rep No symptoms -pt guarded  MSQ-completed-see below   Initiated HEP with seated head turns/head nods       Access Code: [email or web address] URL: [email or web address] Date: 11/05/2024 Prepared by: [facility] - Outpatient  Rehab - [facility]  Exercises - Seated Left Head Turns Vestibular Habituation  - 2 x daily - 7 x weekly - 3 sets - 5-10 reps - Seated Head Nods Vestibular Habituation  - 2 x daily - 7 x weekly - 3 sets - 5-10 reps  PATIENT EDUCATION: Education details: Rationale for treatment and progression today; discussed pt's goals-she does want to be able to lie flat on floor for exercises; discussed hx of motion sensitivity, guarding,improved tolerance for positional testing and treatment today Person educated: Patient Education method: Explanation, Demonstration, Verbal cues, and Handouts  Education comprehension: verbalized understanding, returned demonstration, and needs further education  --------------------------------------------- Note: Objective measures were completed at Evaluation unless otherwise noted.  DIAGNOSTIC FINDINGS: NA for this episode  COGNITION: Overall cognitive status: Within functional limits for tasks assessed   SENSATION: Not tested  POSTURE:  rounded shoulders, forward head, and tends to hold head neck in  slightly upward tipped position in extension  Cervical ROM:    Active A/PROM (deg) eval  Flexion 32  Extension 20  Right lateral flexion   Left lateral flexion   Right rotation 45  Left rotation 45  (Blank rows = not tested)   BED MOBILITY:  Independent  TRANSFERS: Assistive device utilized: None  Sit to stand: Modified independence Stand to sit: Modified independence   GAIT: Gait pattern: guarded after Epley manuever and WFL  Comments: Did not formally assess gait   PATIENT SURVEYS:  DHI: THE DIZZINESS HANDICAP INVENTORY (DHI)  P1. Does looking up increase your problem? 4 = Yes  E2. Because of your problem, do you feel frustrated? 4 = Yes  F3. Because of your problem, do you restrict your travel for business or recreation?  4 = Yes  P4. Does walking down the aisle of a supermarket increase your problems?  2 = Sometimes  F5. Because of your problem, do you have difficulty getting into or out of bed?  4 = Yes  F6. Does your problem significantly restrict your participation in social activities, such as going out to dinner, going to the movies, dancing, or going to parties? 4 = Yes  F7. Because of your problem, do you have difficulty reading?  0 = No  P8. Does performing more ambitious activities such as sports, dancing, household chores (sweeping or putting dishes away) increase your problems?  2 = Sometimes  E9. Because of your problem, are you afraid to leave your home without having without having someone accompany you?  0 = No  E10. Because of your problem have you been embarrassed in front of others?  0 = No  P11. Do quick movements of your head increase your problem?  4 = Yes  F12. Because of your problem, do you avoid heights?  2 = Sometimes  P13. Does turning over in bed increase your problem?  2 = Sometimes  F14. Because of your problem, is it difficult for you to do strenuous homework or yard work? 4 = Yes  E15. Because of your problem, are you afraid people  may think you are intoxicated? 0 = No  F16. Because of your problem, is it difficult for you to go for a walk by yourself?  0 = No  P17. Does walking down a sidewalk increase your problem?  2 = Sometimes  E18.Because of your problem, is it difficult for you to concentrate 2 = Sometimes  F19. Because of your problem, is it difficult for you to walk around your house in the dark? 4 = Yes  E20. Because of your problem, are you afraid to stay home alone?  0 = No  E21. Because of your problem, do you feel handicapped? 0 = No  E22. Has the problem placed stress on your relationships with members of your family or friends? 4 = Yes  E23. Because of your problem, are you depressed?  4 = Yes  F24. Does your problem interfere with your job or household responsibilities?  2 = Sometimes  P25. Does bending over increase your problem?  4 = Yes  TOTAL 58  [facility] Scoring Instructions  The patient is asked to answer each question as it pertains to dizziness or unsteadiness problems, specifically  considering their condition during the last month. Questions are designed to incorporate functional (F), physical  (P), and emotional (E) impacts on disability.   Scores greater than 10 points should be referred to balance specialists for further evaluation.   16-34 Points (mild handicap)  36-52 Points (moderate handicap)  54+ Points (severe handicap)  Minimally Detectable Change: 17 points ([address] [place], 1990)  [place], G. SHAUNNA. and [place], C. W. (1990). The development of the Dizziness Handicap Inventory. Archives of Otolaryngology - Head and Neck Surgery 116(4): [id number].   VESTIBULAR ASSESSMENT:  GENERAL OBSERVATION: No acute distress, wearing glasses (bifocals)   SYMPTOM BEHAVIOR:  Subjective history: She reports having a longstanding hx of dizziness and bumping into things; has always had history of motion sickness.  Several months ago, went to lie down on floor at exercise class and she had  intensity spinning, dizziness, nausea.  Hx of migraines, motion sickness; no hx of falls.  Of note, pt sleeps reclined due to digestive issues  Non-Vestibular symptoms: neck pain, headaches, nausea/vomiting, and migraine symptoms  Type of dizziness: Imbalance (Disequilibrium), Spinning/Vertigo, and Unsteady with head/body turns  Frequency: 1 x in the past several months (avoids positions that brings it on)  Duration: minutes  Aggravating factors: Induced by position change: lying supine, rolling to the right, rolling to the left, and supine to sit and Induced by motion: looking up at the ceiling, turning body quickly, and turning head quickly  Relieving factors: head stationary, lying supine, slow movements, and avoiding movements that bring on symptoms  Progression of symptoms: unchanged  OCULOMOTOR EXAM: wears bifocals  Ocular Alignment: R eye hypermetria   Ocular ROM: No Limitations  Spontaneous Nystagmus: absent  Gaze-Induced Nystagmus: absent  Smooth Pursuits: slowed, some saccadic intrusions  Saccades: some overshooting in posterior direction  Convergence/Divergence: 2 cm   Cover-cross-cover test: NT   VESTIBULAR - OCULAR REFLEX:   Slow VOR: Comment: slowed and mild symptoms  VOR Cancellation: Comment: increased nausea (motion sensitive)  Head-Impulse Test: HIT Right: negative HIT Left: negative  Dynamic Visual Acuity: NT   POSITIONAL TESTING: Right Dix-Hallpike: upbeating, right nystagmus, rotational, latent onset, and Duration: 20 sec Left Dix-Hallpike: NT  Right Roll Test: no nystagmus Left Roll Test: no nystagmus    M-CTSIB  Condition 1: Firm Surface, EO 30 Sec, Normal Sway  Condition 2: Firm Surface, EC 30 Sec, Mild Sway  Condition 3: Foam Surface, EO NT Sec, NT Sway  Condition 4: Foam Surface, EC NT Sec, NT Sway    MOTION SENSITIVITY:  NOT TESTED AT EVAL; TESTED 11/05/2024  Motion Sensitivity Quotient Intensity: 0 = none, 1 = Lightheaded, 2 = Mild, 3 = Moderate, 4  = Severe, 5 = Vomiting  Intensity  1. Sitting to supine 1  2. Supine to L side NT  3. Supine to R side 3  4. Supine to sitting 0  5. L Hallpike-Dix NT  6. Up from L    7. R Hallpike-Dix 2  8. Up from R    9. Sitting, head tipped to L knee 3-4  10. Head up from L knee 3-4  11. Sitting, head tipped to R knee 3-4  12. Head up from R knee 3-4  13. Sitting head turns x5 3  14.Sitting head nods x5 2  15. In stance, 180 turn to L  2  16. In stance, 180  turn to R 2    OTHOSTATICS: not done                                                                                                                              TREATMENT DATE: 10/23/2024   Canalith Repositioning:  Epley Right: Number of Reps: 1, Response to Treatment: comment: tolerated treatment maneuever well, and Comment: nauseated, vomiting at end of session  *She reports wanting to get to fresh air and she did have episode of emesis upon walking out of lobby at end of session.  PT assisted pt to her car. PATIENT EDUCATION: Education details: Eval results, POC; rationale for BPPV treatment, post CRM expectations Person educated: Patient Education method: Explanation Education comprehension: verbalized understanding  HOME EXERCISE PROGRAM:  GOALS: Goals reviewed with patient? Yes  SHORT TERM GOALS: = LTGs  LONG TERM GOALS: Target date: 11/20/2024  Pt will be independent with HEP for improved dizziness and balance. Baseline:  Goal status: INITIAL  2.  DHI score to improve to less than or equal to 36 for improved dizziness impacting daily activities. Baseline: 54 Goal status: INITIAL  3.  Pt will report 0/10 dizziness with bed mobility. Baseline:  Goal status: INITIAL  4.  FGA to be assessed, with goal to improve to at least 22/30 for decreased fall risk. Baseline: TBD Goal status: INITIAL   ASSESSMENT:  CLINICAL IMPRESSION: Pt presents today and reports 2 days of sickness following PT eval, and additional  several days of motion sickness and vomiting following trip to [place]  over Thanksgiving.  She took Dramamine prior to coming into session today; she is able to tolerate positional testing today, with positive DH on R (latent response and much less intense/shorter duration than eval).  She tolerates Epley today without nausea.  Looked at initial habituation of motion, with Wilhelmena Carrel not bringing on symptoms today, so did not give for HEP; instead performed MSQ and provided seated head motions as part of habituation exercises.  Discussed rationale for treatment progression and pt's goals-she wants to be able to get onto floor for exercises even though she rarely lies supine for bed.  Will continue to reassess for BPPV and progress habituation exercises for motion sensitivity and to address vestibular system for balance.     EVAL:  Patient is a 68 y.o. female who was seen today for physical therapy evaluation and treatment for vertigo.  She reports longstanding history of dizziness, bumping into things, and motion sickness; she has hx of migraines, neck pain, and she sleeps in reclined/propped position due to digestive issues.  She reports episode about 2 months ago, where she went to lie down on exercise mat for exercise class and began to have intense spinning, nausea, which lasted minutes; once this episode was over, she had to be helped up and moved slowly and felt bad the rest of the day.  With oculomotor testing today, she does have some saccadic eye movements  and R eye hypermetria; she also has slowed VOR and increase of symptoms come on with VOR and with VOR cancellation.  She has mod sway on Condition 4 of MCTSIB.  With positional testing for BPPV, she is positive for R posterior canal BPPV (negative for R and L roll test; unable to test for L posterior canal, as pt feels increasingly symptomatic and bad upon sitting up from initial testing).  Pt is agreeable to completing Epley maneuver to R side,  which she tolerated well; however, upon completion, pt reports increased feeling of heat, nausea, and had emesis episode.  Pt would benefit from further PT to address dizziness, vertigo, and likely decreased vestibular system use for balance due to hx of migraines, vertigo, and motion sensitivity.    OBJECTIVE IMPAIRMENTS: decreased balance, dizziness, impaired flexibility, and postural dysfunction.   ACTIVITY LIMITATIONS: bending, bed mobility, reach over head, and locomotion level  PARTICIPATION LIMITATIONS: meal prep, cleaning, laundry, driving, shopping, and community activity  PERSONAL FACTORS: 3+ comorbidities: see above are also affecting patient's functional outcome.   REHAB POTENTIAL: Good  CLINICAL DECISION MAKING: Stable/uncomplicated  EVALUATION COMPLEXITY: Low   PLAN:  PT FREQUENCY: 1-2x/week  PT DURATION: 4 weeks plus eval  PLANNED INTERVENTIONS: 97750- Physical Performance Testing, 97110-Therapeutic exercises, 97530- Therapeutic activity, [id number]- Neuromuscular re-education, 97535- Self Care, [id number]- Manual therapy, [phone]- Canalith repositioning, Patient/Family education, Balance training, and Vestibular training  PLAN FOR NEXT SESSION: Reassess for positional vertigo (R and L) and treat as needed; review/progress habituation, VOR exercises as pt tolerates.  Assess FGA   Camree Wigington W., PT 11/05/2024, 2:43 PM   [facility] at [facility] [address] [place], [address] [place], [place] [address] Phone # [phone] Fax # [phone]

## 2024-11-12 ENCOUNTER — Encounter: Payer: Self-pay | Admitting: Physical Therapy

## 2024-11-12 ENCOUNTER — Ambulatory Visit: Admitting: Physical Therapy

## 2024-11-12 DIAGNOSIS — R42 Dizziness and giddiness: Secondary | ICD-10-CM | POA: Diagnosis not present

## 2024-11-12 DIAGNOSIS — H8111 Benign paroxysmal vertigo, right ear: Secondary | ICD-10-CM

## 2024-11-12 NOTE — Therapy (Signed)
 OUTPATIENT PHYSICAL THERAPY VESTIBULAR TREATMENT NOTE     Patient Name: [name] [name] MRN: [id number] DOB:07/19/1956, 68 y.o., female Today's Date: 11/12/2024  END OF SESSION:  PT End of Session - 11/12/24 1101     Visit Number 3    Number of Visits 9    Date for Recertification  11/20/24    Authorization Type [employer]    Authorization Time Period 10/23/2024-11/20/2024    Authorization - Visit Number 3    Authorization - Number of Visits 9    PT Start Time 1103    PT Stop Time 1145    PT Time Calculation (min) 42 min    Activity Tolerance Patient tolerated treatment well    Behavior During Therapy WFL for tasks assessed/performed            Past Medical History:  Diagnosis Date   Asthma    Eczema    Recurrent upper respiratory infection (URI)    Past Surgical History:  Procedure Laterality Date   BREAST EXCISIONAL BIOPSY Right 2000?   Pt states exc bx, benign, for milk duct, unsure which side, cannot see scar   SINOSCOPY     Patient Active Problem List   Diagnosis Date Noted   Seasonal and perennial allergic rhinitis 06/10/2023   Moderate persistent asthma without complication 02/10/2021   Seasonal and perennial allergic rhinoconjunctivitis 02/10/2021   Dyshidrotic eczema 12/12/2020   Heartburn 12/12/2020   HYPERCHOLESTEROLEMIA 01/20/2008   DEPRESSION 01/20/2008   Essential hypertension 01/20/2008   Diaphragmatic hernia 01/20/2008   DIARRHEA 01/20/2008   GASTROESOPHAGEAL REFLUX DISEASE 10/20/2007    PCP: Cleotilde Planas, MD  REFERRING PROVIDER: Cleotilde Planas, MD   REFERRING DIAG: R42 (ICD-10-CM) - Vertigo   THERAPY DIAG:  Dizziness and giddiness  BPPV (benign paroxysmal positional vertigo), right  ONSET DATE: 09/01/2024 MD referral  Rationale for Evaluation and Treatment: Rehabilitation  SUBJECTIVE:   SUBJECTIVE STATEMENT: I feel better.  I feel like the dizziness is improving.  Don't have the sensation of spinning anymore. Pt  accompanied by: self  PERTINENT HISTORY: depression, obesity, HLD, essential HTN, ADHD, hx of vertigo (>25 years ago); hx of migraines  PAIN:  Are you having pain? No  PRECAUTIONS: None  RED FLAGS: None   WEIGHT BEARING RESTRICTIONS: No  FALLS: Has patient fallen in last 6 months? No  LIVING ENVIRONMENT: Lives with: lives with their spouse   PLOF: Independent  PATIENT GOALS: To figure out about this dizziness/vertigo  OBJECTIVE:   No dramamine today. TODAY'S TREATMENT: 11/12/2024 Activity Comments  FGA 19/30   Review of HEP-no symptoms, just stiffness in neck   R Dix Hallpike Very brief wave of dizziness, very brief nystagmus  R Epley Tolerates well  R Dix Hallpike Negative, very brief wave of dizziness, no noted nystagmus  R Epley Tolerates well, feels good upon sitting   R Brandt-Daroff, performed x 1 No symptoms, 2 pillows  Seated VOR x 1 horizontal  Vertical No symptoms x 30 sec  Standing VOR x 1 horizontal 30 sec VOR x 1 vertical 30 seconds Mild waves of dizziness Sees visual distortions with boxes in periphery       [facility] PT Assessment - 11/12/24 1108       Functional Gait  Assessment   Gait assessed  Yes    Gait Level Surface Walks 20 ft in less than 7 sec but greater than 5.5 sec, uses assistive device, slower speed, mild gait deviations, or deviates 6-10 in outside of the 12 in  walkway width.   6.5   Change in Gait Speed Able to change speed, demonstrates mild gait deviations, deviates 6-10 in outside of the 12 in walkway width, or no gait deviations, unable to achieve a major change in velocity, or uses a change in velocity, or uses an assistive device.    Gait with Horizontal Head Turns Performs head turns with moderate changes in gait velocity, slows down, deviates 10-15 in outside 12 in walkway width but recovers, can continue to walk.   12 sec   Gait with Vertical Head Turns Performs task with moderate change in gait velocity, slows down, deviates 10-15  in outside 12 in walkway width but recovers, can continue to walk.   10.5   Gait and Pivot Turn Pivot turns safely in greater than 3 sec and stops with no loss of balance, or pivot turns safely within 3 sec and stops with mild imbalance, requires small steps to catch balance.    Step Over Obstacle Is able to step over one shoe box (4.5 in total height) without changing gait speed. No evidence of imbalance.    Gait with Narrow Base of Support Is able to ambulate for 10 steps heel to toe with no staggering.    Gait with Eyes Closed Cannot walk 20 ft without assistance, severe gait deviations or imbalance, deviates greater than 15 in outside 12 in walkway width or will not attempt task.   veers to L   Ambulating Backwards Walks 20 ft, no assistive devices, good speed, no evidence for imbalance, normal gait    Steps Alternating feet, no rail.    Total Score 19    FGA comment: Scores <22/30 indicate increased fall risk            Access Code: [email or web address] URL: [email or web address] Date: 11/12/2024 Prepared by: [facility] - Outpatient  Rehab - [facility]  Exercises - Brandt-Daroff Vestibular Exercise  - 1-2 x daily - 7 x weekly - 1 sets - 3 reps - Standing Gaze Stabilization with Head Nod  - 1-2 x daily - 7 x weekly - 1 sets - 3 reps - 30 sec hold - Standing Gaze Stabilization with Head Rotation  - 1-2 x daily - 7 x weekly - 1 sets - 3 reps - 30 sec hold   PATIENT EDUCATION: Education details: Progression of HEP Person educated: Patient Education method: Explanation, Demonstration, Verbal cues, and Handouts Education comprehension: verbalized understanding, returned demonstration, and needs further education  --------------------------------------------- Note: Objective measures were completed at Evaluation unless otherwise noted.  DIAGNOSTIC FINDINGS: NA for this episode  COGNITION: Overall cognitive status: Within functional limits for tasks  assessed   SENSATION: Not tested  POSTURE:  rounded shoulders, forward head, and tends to hold head neck in slightly upward tipped position in extension  Cervical ROM:    Active A/PROM (deg) eval  Flexion 32  Extension 20  Right lateral flexion   Left lateral flexion   Right rotation 45  Left rotation 45  (Blank rows = not tested)   BED MOBILITY:  Independent  TRANSFERS: Assistive device utilized: None  Sit to stand: Modified independence Stand to sit: Modified independence   GAIT: Gait pattern: guarded after Epley manuever and WFL  Comments: Did not formally assess gait   PATIENT SURVEYS:  DHI: THE DIZZINESS HANDICAP INVENTORY (DHI)  P1. Does looking up increase your problem? 4 = Yes  E2. Because of your problem, do you feel frustrated? 4 = Yes  F3. Because of  your problem, do you restrict your travel for business or recreation?  4 = Yes  P4. Does walking down the aisle of a supermarket increase your problems?  2 = Sometimes  F5. Because of your problem, do you have difficulty getting into or out of bed?  4 = Yes  F6. Does your problem significantly restrict your participation in social activities, such as going out to dinner, going to the movies, dancing, or going to parties? 4 = Yes  F7. Because of your problem, do you have difficulty reading?  0 = No  P8. Does performing more ambitious activities such as sports, dancing, household chores (sweeping or putting dishes away) increase your problems?  2 = Sometimes  E9. Because of your problem, are you afraid to leave your home without having without having someone accompany you?  0 = No  E10. Because of your problem have you been embarrassed in front of others?  0 = No  P11. Do quick movements of your head increase your problem?  4 = Yes  F12. Because of your problem, do you avoid heights?  2 = Sometimes  P13. Does turning over in bed increase your problem?  2 = Sometimes  F14. Because of your problem, is it  difficult for you to do strenuous homework or yard work? 4 = Yes  E15. Because of your problem, are you afraid people may think you are intoxicated? 0 = No  F16. Because of your problem, is it difficult for you to go for a walk by yourself?  0 = No  P17. Does walking down a sidewalk increase your problem?  2 = Sometimes  E18.Because of your problem, is it difficult for you to concentrate 2 = Sometimes  F19. Because of your problem, is it difficult for you to walk around your house in the dark? 4 = Yes  E20. Because of your problem, are you afraid to stay home alone?  0 = No  E21. Because of your problem, do you feel handicapped? 0 = No  E22. Has the problem placed stress on your relationships with members of your family or friends? 4 = Yes  E23. Because of your problem, are you depressed?  4 = Yes  F24. Does your problem interfere with your job or household responsibilities?  2 = Sometimes  P25. Does bending over increase your problem?  4 = Yes  TOTAL 58    DHI Scoring Instructions  The patient is asked to answer each question as it pertains to dizziness or unsteadiness problems, specifically  considering their condition during the last month. Questions are designed to incorporate functional (F), physical  (P), and emotional (E) impacts on disability.   Scores greater than 10 points should be referred to balance specialists for further evaluation.   16-34 Points (mild handicap)  36-52 Points (moderate handicap)  54+ Points (severe handicap)  Minimally Detectable Change: 17 points ([address] [place], 1990)  Dundalk, G. SHAUNNA. and Santiago, C. W. (1990). The development of the Dizziness Handicap Inventory. Archives of Otolaryngology - Head and Neck Surgery 116(4): [id number].   VESTIBULAR ASSESSMENT:  GENERAL OBSERVATION: No acute distress, wearing glasses (bifocals)   SYMPTOM BEHAVIOR:  Subjective history: She reports having a longstanding hx of dizziness and bumping into things; has  always had history of motion sickness.  Several months ago, went to lie down on floor at exercise class and she had intensity spinning, dizziness, nausea.  Hx of migraines, motion sickness; no hx of falls.  Of note, pt sleeps reclined due to digestive issues  Non-Vestibular symptoms: neck pain, headaches, nausea/vomiting, and migraine symptoms  Type of dizziness: Imbalance (Disequilibrium), Spinning/Vertigo, and Unsteady with head/body turns  Frequency: 1 x in the past several months (avoids positions that brings it on)  Duration: minutes  Aggravating factors: Induced by position change: lying supine, rolling to the right, rolling to the left, and supine to sit and Induced by motion: looking up at the ceiling, turning body quickly, and turning head quickly  Relieving factors: head stationary, lying supine, slow movements, and avoiding movements that bring on symptoms  Progression of symptoms: unchanged  OCULOMOTOR EXAM: wears bifocals  Ocular Alignment: R eye hypermetria   Ocular ROM: No Limitations  Spontaneous Nystagmus: absent  Gaze-Induced Nystagmus: absent  Smooth Pursuits: slowed, some saccadic intrusions  Saccades: some overshooting in posterior direction  Convergence/Divergence: 2 cm   Cover-cross-cover test: NT   VESTIBULAR - OCULAR REFLEX:   Slow VOR: Comment: slowed and mild symptoms  VOR Cancellation: Comment: increased nausea (motion sensitive)  Head-Impulse Test: HIT Right: negative HIT Left: negative  Dynamic Visual Acuity: NT   POSITIONAL TESTING: Right Dix-Hallpike: upbeating, right nystagmus, rotational, latent onset, and Duration: 20 sec Left Dix-Hallpike: NT  Right Roll Test: no nystagmus Left Roll Test: no nystagmus    M-CTSIB  Condition 1: Firm Surface, EO 30 Sec, Normal Sway  Condition 2: Firm Surface, EC 30 Sec, Mild Sway  Condition 3: Foam Surface, EO NT Sec, NT Sway  Condition 4: Foam Surface, EC NT Sec, NT Sway    MOTION SENSITIVITY:  NOT TESTED AT  EVAL; TESTED 11/05/2024  Motion Sensitivity Quotient Intensity: 0 = none, 1 = Lightheaded, 2 = Mild, 3 = Moderate, 4 = Severe, 5 = Vomiting  Intensity  1. Sitting to supine 1  2. Supine to L side NT  3. Supine to R side 3  4. Supine to sitting 0  5. L Hallpike-Dix NT  6. Up from L    7. R Hallpike-Dix 2  8. Up from R    9. Sitting, head tipped to L knee 3-4  10. Head up from L knee 3-4  11. Sitting, head tipped to R knee 3-4  12. Head up from R knee 3-4  13. Sitting head turns x5 3  14.Sitting head nods x5 2  15. In stance, 180 turn to L  2  16. In stance, 180 turn to R 2    OTHOSTATICS: not done                                                                                                                              TREATMENT DATE: 10/23/2024   Canalith Repositioning:  Epley Right: Number of Reps: 1, Response to Treatment: comment: tolerated treatment maneuever well, and Comment: nauseated, vomiting at end of session  *She reports wanting to get to fresh air and she did have episode of emesis upon walking out  of lobby at end of session.  PT assisted pt to her car. PATIENT EDUCATION: Education details: Eval results, POC; rationale for BPPV treatment, post CRM expectations Person educated: Patient Education method: Explanation Education comprehension: verbalized understanding  HOME EXERCISE PROGRAM:  GOALS: Goals reviewed with patient? Yes  SHORT TERM GOALS: = LTGs  LONG TERM GOALS: Target date: 11/20/2024  Pt will be independent with HEP for improved dizziness and balance. Baseline:  Goal status: INITIAL  2.  DHI score to improve to less than or equal to 36 for improved dizziness impacting daily activities. Baseline: 54 Goal status: INITIAL  3.  Pt will report 0/10 dizziness with bed mobility. Baseline:  Goal status: INITIAL  4.  FGA to be assessed, with goal to improve to at least 22/30 for decreased fall risk. Baseline: 19/30 Goal status:  INITIAL   ASSESSMENT:  CLINICAL IMPRESSION: Pt presents today with reports of improving symptoms. Skilled PT session focused on reassessing R Trenda Craze, with very minor/brief spinning sensation and brief nystagmus.  Treated Epley x 2, with pt reporting no dizziness and tolerating well.  Provided Brandt-Daroff for habituation, with instruction to lessen pillows, in hopes to be able to work towards full supine lying down to get to exercise class.  Progressed HEP to include standing VOR, with pt having some visual distortions on periphery with target on wall. FGA score 19/30 indicates increased fall risk; most difficulty with EC and head motions with gait.  She is making nice progress.  Pt will continue to benefit from skilled PT towards goals for improved functional mobility and decreased fall risk.   EVAL:  Patient is a 68 y.o. female who was seen today for physical therapy evaluation and treatment for vertigo.  She reports longstanding history of dizziness, bumping into things, and motion sickness; she has hx of migraines, neck pain, and she sleeps in reclined/propped position due to digestive issues.  She reports episode about 2 months ago, where she went to lie down on exercise mat for exercise class and began to have intense spinning, nausea, which lasted minutes; once this episode was over, she had to be helped up and moved slowly and felt bad the rest of the day.  With oculomotor testing today, she does have some saccadic eye movements and R eye hypermetria; she also has slowed VOR and increase of symptoms come on with VOR and with VOR cancellation.  She has mod sway on Condition 4 of MCTSIB.  With positional testing for BPPV, she is positive for R posterior canal BPPV (negative for R and L roll test; unable to test for L posterior canal, as pt feels increasingly symptomatic and bad upon sitting up from initial testing).  Pt is agreeable to completing Epley maneuver to R side, which she tolerated  well; however, upon completion, pt reports increased feeling of heat, nausea, and had emesis episode.  Pt would benefit from further PT to address dizziness, vertigo, and likely decreased vestibular system use for balance due to hx of migraines, vertigo, and motion sensitivity.    OBJECTIVE IMPAIRMENTS: decreased balance, dizziness, impaired flexibility, and postural dysfunction.   ACTIVITY LIMITATIONS: bending, bed mobility, reach over head, and locomotion level  PARTICIPATION LIMITATIONS: meal prep, cleaning, laundry, driving, shopping, and community activity  PERSONAL FACTORS: 3+ comorbidities: see above are also affecting patient's functional outcome.   REHAB POTENTIAL: Good  CLINICAL DECISION MAKING: Stable/uncomplicated  EVALUATION COMPLEXITY: Low   PLAN:  PT FREQUENCY: 1-2x/week  PT DURATION: 4 weeks plus eval  PLANNED INTERVENTIONS: 97750- Physical Performance Testing, 97110-Therapeutic exercises, 97530- Therapeutic activity, [id number]- Neuromuscular re-education, 97535- Self Care, [id number]- Manual therapy, [phone]- Canalith repositioning, Patient/Family education, Balance training, and Vestibular training  PLAN FOR NEXT SESSION: Need to check LTGs and recert next visit.  Work on supine and rolling for habituation.  Reassess for positional vertigo (R and L) and treat as needed; review/progress habituation, VOR exercises as pt tolerates.     STARLET GREIG ORN., PT 11/12/2024, 1:00 PM   [facility] at [facility] [address] [place], [address] [place], [place] [address] Phone # [phone] Fax # [phone]

## 2024-11-19 ENCOUNTER — Ambulatory Visit: Admitting: Physical Therapy

## 2024-11-24 ENCOUNTER — Ambulatory Visit: Admitting: Physical Therapy

## 2024-11-24 ENCOUNTER — Encounter: Payer: Self-pay | Admitting: Physical Therapy

## 2024-11-24 DIAGNOSIS — R42 Dizziness and giddiness: Secondary | ICD-10-CM | POA: Diagnosis not present

## 2024-11-24 DIAGNOSIS — H8111 Benign paroxysmal vertigo, right ear: Secondary | ICD-10-CM

## 2024-11-24 NOTE — Therapy (Signed)
 " OUTPATIENT PHYSICAL THERAPY VESTIBULAR TREATMENT NOTE/RECERT     Patient Name: [name] [name] MRN: [id number] DOB:09/05/1956, 68 y.o., female Today's Date: 11/24/2024  END OF SESSION:  PT End of Session - 11/24/24 1232     Visit Number 4    Number of Visits 9    Date for Recertification  01/01/25    Authorization Type [employer]-reauth submitted    Authorization Time Period --    Authorization - Visit Number 4    Authorization - Number of Visits 9    PT Start Time 1233    PT Stop Time 1317    PT Time Calculation (min) 44 min    Activity Tolerance Patient tolerated treatment well    Behavior During Therapy WFL for tasks assessed/performed            Past Medical History:  Diagnosis Date   Asthma    Eczema    Recurrent upper respiratory infection (URI)    Past Surgical History:  Procedure Laterality Date   BREAST EXCISIONAL BIOPSY Right 2000?   Pt states exc bx, benign, for milk duct, unsure which side, cannot see scar   SINOSCOPY     Patient Active Problem List   Diagnosis Date Noted   Seasonal and perennial allergic rhinitis 06/10/2023   Moderate persistent asthma without complication 02/10/2021   Seasonal and perennial allergic rhinoconjunctivitis 02/10/2021   Dyshidrotic eczema 12/12/2020   Heartburn 12/12/2020   HYPERCHOLESTEROLEMIA 01/20/2008   DEPRESSION 01/20/2008   Essential hypertension 01/20/2008   Diaphragmatic hernia 01/20/2008   DIARRHEA 01/20/2008   GASTROESOPHAGEAL REFLUX DISEASE 10/20/2007    PCP: Cleotilde Planas, MD  REFERRING PROVIDER: Cleotilde Planas, MD   REFERRING DIAG: R42 (ICD-10-CM) - Vertigo   THERAPY DIAG:  Dizziness and giddiness  BPPV (benign paroxysmal positional vertigo), right  ONSET DATE: 09/01/2024 MD referral  Rationale for Evaluation and Treatment: Rehabilitation  SUBJECTIVE:   SUBJECTIVE STATEMENT: I was feeling not as great-due to heat being out at my house and just got that fixed.  No spinning sensation  anymore. Pt accompanied by: self  PERTINENT HISTORY: depression, obesity, HLD, essential HTN, ADHD, hx of vertigo (>25 years ago); hx of migraines  PAIN:  Are you having pain? No  PRECAUTIONS: None  RED FLAGS: None   WEIGHT BEARING RESTRICTIONS: No  FALLS: Has patient fallen in last 6 months? No  LIVING ENVIRONMENT: Lives with: lives with their spouse   PLOF: Independent  PATIENT GOALS: To figure out about this dizziness/vertigo  OBJECTIVE:    TODAY'S TREATMENT: 11/24/2024 Activity Comments  Brandt-Daroff to R-no symptoms 1 pillow  Brandt-Daroff to L-no symptoms 1 pillow  Simulated getting on the floor for yoga class Mod I, extra time No symptoms  R Epley Negative  L Epley Negative  R roll Negative  L roll negative  Standing gaze stabilization horziontal and vertical x 20 sec No symptoms, busy background  Gait 25 ft x 4 reps   Standing horizontal head motion, vertical head motion x 5 reps each EO Mild sway noted vertical head motions  Gait with gaze stabilization Slowed pace, no symptoms  Corner balance EC + head turns/nods Feet apart, feet together, with mild sway  DHI score 12 Improved from 58    Access Code: [email or web address] URL: [email or web address] Date: 11/24/2024 Prepared by: [facility] - Outpatient  Rehab - [facility]  Exercises - Brandt-Daroff Vestibular Exercise  - 1-2 x daily - 7 x weekly - 1 sets - 3 reps - Walking Gaze Stabilization  Head Rotation  - 1 x daily - 7 x weekly - 1 sets - 3-5 reps - Walking Gaze Stabilization Head Nod  - 1 x daily - 7 x weekly - 1 sets - 3-5 reps - Corner Balance Feet Apart: Eyes Closed With Head Turns  - 1 x daily - 7 x weekly - 2 sets - 30 sec hold - Corner Balance Feet Together: Eyes Closed With Head Turns  - 1 x daily - 7 x weekly - 2 sets - 30 sec hold       PATIENT EDUCATION: Education details: Progression of HEP, progress towards goals, POC Person educated: Patient Education method:  Explanation, Demonstration, Verbal cues, and Handouts Education comprehension: verbalized understanding, returned demonstration, and needs further education  --------------------------------------------- Note: Objective measures were completed at Evaluation unless otherwise noted.  DIAGNOSTIC FINDINGS: NA for this episode  COGNITION: Overall cognitive status: Within functional limits for tasks assessed   SENSATION: Not tested  POSTURE:  rounded shoulders, forward head, and tends to hold head neck in slightly upward tipped position in extension  Cervical ROM:    Active A/PROM (deg) eval  Flexion 32  Extension 20  Right lateral flexion   Left lateral flexion   Right rotation 45  Left rotation 45  (Blank rows = not tested)   BED MOBILITY:  Independent  TRANSFERS: Assistive device utilized: None  Sit to stand: Modified independence Stand to sit: Modified independence   GAIT: Gait pattern: guarded after Epley manuever and WFL  Comments: Did not formally assess gait   PATIENT SURVEYS:  DHI: THE DIZZINESS HANDICAP INVENTORY (DHI)  P1. Does looking up increase your problem? 4 = Yes  E2. Because of your problem, do you feel frustrated? 4 = Yes  F3. Because of your problem, do you restrict your travel for business or recreation?  4 = Yes  P4. Does walking down the aisle of a supermarket increase your problems?  2 = Sometimes  F5. Because of your problem, do you have difficulty getting into or out of bed?  4 = Yes  F6. Does your problem significantly restrict your participation in social activities, such as going out to dinner, going to the movies, dancing, or going to parties? 4 = Yes  F7. Because of your problem, do you have difficulty reading?  0 = No  P8. Does performing more ambitious activities such as sports, dancing, household chores (sweeping or putting dishes away) increase your problems?  2 = Sometimes  E9. Because of your problem, are you afraid to leave your  home without having without having someone accompany you?  0 = No  E10. Because of your problem have you been embarrassed in front of others?  0 = No  P11. Do quick movements of your head increase your problem?  4 = Yes  F12. Because of your problem, do you avoid heights?  2 = Sometimes  P13. Does turning over in bed increase your problem?  2 = Sometimes  F14. Because of your problem, is it difficult for you to do strenuous homework or yard work? 4 = Yes  E15. Because of your problem, are you afraid people may think you are intoxicated? 0 = No  F16. Because of your problem, is it difficult for you to go for a walk by yourself?  0 = No  P17. Does walking down a sidewalk increase your problem?  2 = Sometimes  E18.Because of your problem, is it difficult for you to concentrate 2 =  Sometimes  F19. Because of your problem, is it difficult for you to walk around your house in the dark? 4 = Yes  E20. Because of your problem, are you afraid to stay home alone?  0 = No  E21. Because of your problem, do you feel handicapped? 0 = No  E22. Has the problem placed stress on your relationships with members of your family or friends? 4 = Yes  E23. Because of your problem, are you depressed?  4 = Yes  F24. Does your problem interfere with your job or household responsibilities?  2 = Sometimes  P25. Does bending over increase your problem?  4 = Yes  TOTAL 58    DHI Scoring Instructions  The patient is asked to answer each question as it pertains to dizziness or unsteadiness problems, specifically  considering their condition during the last month. Questions are designed to incorporate functional (F), physical  (P), and emotional (E) impacts on disability.   Scores greater than 10 points should be referred to balance specialists for further evaluation.   16-34 Points (mild handicap)  36-52 Points (moderate handicap)  54+ Points (severe handicap)  Minimally Detectable Change: 17 points ([address] [place],  1990)  [place], G. SHAUNNA. and [place], C. W. (1990). The development of the Dizziness Handicap Inventory. Archives of Otolaryngology - Head and Neck Surgery 116(4): [id number].   VESTIBULAR ASSESSMENT:  GENERAL OBSERVATION: No acute distress, wearing glasses (bifocals)   SYMPTOM BEHAVIOR:  Subjective history: She reports having a longstanding hx of dizziness and bumping into things; has always had history of motion sickness.  Several months ago, went to lie down on floor at exercise class and she had intensity spinning, dizziness, nausea.  Hx of migraines, motion sickness; no hx of falls.  Of note, pt sleeps reclined due to digestive issues  Non-Vestibular symptoms: neck pain, headaches, nausea/vomiting, and migraine symptoms  Type of dizziness: Imbalance (Disequilibrium), Spinning/Vertigo, and Unsteady with head/body turns  Frequency: 1 x in the past several months (avoids positions that brings it on)  Duration: minutes  Aggravating factors: Induced by position change: lying supine, rolling to the right, rolling to the left, and supine to sit and Induced by motion: looking up at the ceiling, turning body quickly, and turning head quickly  Relieving factors: head stationary, lying supine, slow movements, and avoiding movements that bring on symptoms  Progression of symptoms: unchanged  OCULOMOTOR EXAM: wears bifocals  Ocular Alignment: R eye hypermetria   Ocular ROM: No Limitations  Spontaneous Nystagmus: absent  Gaze-Induced Nystagmus: absent  Smooth Pursuits: slowed, some saccadic intrusions  Saccades: some overshooting in posterior direction  Convergence/Divergence: 2 cm   Cover-cross-cover test: NT   VESTIBULAR - OCULAR REFLEX:   Slow VOR: Comment: slowed and mild symptoms  VOR Cancellation: Comment: increased nausea (motion sensitive)  Head-Impulse Test: HIT Right: negative HIT Left: negative  Dynamic Visual Acuity: NT   POSITIONAL TESTING: Right Dix-Hallpike: upbeating, right  nystagmus, rotational, latent onset, and Duration: 20 sec Left Dix-Hallpike: NT  Right Roll Test: no nystagmus Left Roll Test: no nystagmus    M-CTSIB  Condition 1: Firm Surface, EO 30 Sec, Normal Sway  Condition 2: Firm Surface, EC 30 Sec, Mild Sway  Condition 3: Foam Surface, EO NT Sec, NT Sway  Condition 4: Foam Surface, EC NT Sec, NT Sway    MOTION SENSITIVITY:  NOT TESTED AT EVAL; TESTED 11/05/2024  Motion Sensitivity Quotient Intensity: 0 = none, 1 = Lightheaded, 2 = Mild, 3 = Moderate,  4 = Severe, 5 = Vomiting  Intensity  1. Sitting to supine 1  2. Supine to L side NT  3. Supine to R side 3  4. Supine to sitting 0  5. L Hallpike-Dix NT  6. Up from L    7. R Hallpike-Dix 2  8. Up from R    9. Sitting, head tipped to L knee 3-4  10. Head up from L knee 3-4  11. Sitting, head tipped to R knee 3-4  12. Head up from R knee 3-4  13. Sitting head turns x5 3  14.Sitting head nods x5 2  15. In stance, 180 turn to L  2  16. In stance, 180 turn to R 2    OTHOSTATICS: not done                                                                                                                              TREATMENT DATE: 10/23/2024   Canalith Repositioning:  Epley Right: Number of Reps: 1, Response to Treatment: comment: tolerated treatment maneuever well, and Comment: nauseated, vomiting at end of session  *She reports wanting to get to fresh air and she did have episode of emesis upon walking out of lobby at end of session.  PT assisted pt to her car. PATIENT EDUCATION: Education details: Eval results, POC; rationale for BPPV treatment, post CRM expectations Person educated: Patient Education method: Explanation Education comprehension: verbalized understanding  HOME EXERCISE PROGRAM:  GOALS: Goals reviewed with patient? Yes  SHORT TERM GOALS: = LTGs  LONG TERM GOALS: Target date: 11/20/2024>UPDATED TARGET 01/01/2025  Pt will be independent with HEP for improved  dizziness and balance. Baseline:  Goal status: MET 11/24/2024  2.  DHI score to improve to less than or equal to 36 for improved dizziness impacting daily activities. Baseline: 58>12 11/24/2024 Goal status: MET12/23/2025  3.  Pt will report 0/10 dizziness with bed mobility. Baseline:  no dizziness, unless she moves too fast. Goal status: MET  4.  FGA to be assessed, with goal to improve to at least 22/30 for decreased fall risk. Baseline: 19/30 Goal status: IN PROGRESS   ASSESSMENT:  CLINICAL IMPRESSION: Pt presents today with continued improvements, with no dizziness noted.  Looked at [employer] both sides (2 pillows>1 pillow) as well as getting on/off floor simulating exercise mat/class situation, all with no dizziness of spinning.  Skilled PT session focused on progressing exercises for corner multi-sensory balance as well as gait with gaze stabilization.  Pt is slow and guarded, but no symptoms brought on.  She does has mild sway/imbalance with head motions vertical direction.  Reassessed all positions for BPPV, with no nystagmus, no symptoms present.  Pt feels she would like to keep working at home and demo understanding of progression of HEP.  Currently, she has met 3 of 4 LTGs.  She agrees to holding chart open x 30 days, should symptoms reoccur; if she does not return,  complete discharge at that time.     EVAL:  Patient is a 68 y.o. female who was seen today for physical therapy evaluation and treatment for vertigo.  She reports longstanding history of dizziness, bumping into things, and motion sickness; she has hx of migraines, neck pain, and she sleeps in reclined/propped position due to digestive issues.  She reports episode about 2 months ago, where she went to lie down on exercise mat for exercise class and began to have intense spinning, nausea, which lasted minutes; once this episode was over, she had to be helped up and moved slowly and felt bad the rest of the day.  With  oculomotor testing today, she does have some saccadic eye movements and R eye hypermetria; she also has slowed VOR and increase of symptoms come on with VOR and with VOR cancellation.  She has mod sway on Condition 4 of MCTSIB.  With positional testing for BPPV, she is positive for R posterior canal BPPV (negative for R and L roll test; unable to test for L posterior canal, as pt feels increasingly symptomatic and bad upon sitting up from initial testing).  Pt is agreeable to completing Epley maneuver to R side, which she tolerated well; however, upon completion, pt reports increased feeling of heat, nausea, and had emesis episode.  Pt would benefit from further PT to address dizziness, vertigo, and likely decreased vestibular system use for balance due to hx of migraines, vertigo, and motion sensitivity.    OBJECTIVE IMPAIRMENTS: decreased balance, dizziness, impaired flexibility, and postural dysfunction.   ACTIVITY LIMITATIONS: bending, bed mobility, reach over head, and locomotion level  PARTICIPATION LIMITATIONS: meal prep, cleaning, laundry, driving, shopping, and community activity  PERSONAL FACTORS: 3+ comorbidities: see above are also affecting patient's functional outcome.   REHAB POTENTIAL: Good  CLINICAL DECISION MAKING: Stable/uncomplicated  EVALUATION COMPLEXITY: Low   PLAN:  PT FREQUENCY: 1x/week  PT DURATION: other: 5 weeks (recert to include 11/24/24)  PLANNED INTERVENTIONS: 97750- Physical Performance Testing, 97110-Therapeutic exercises, 97530- Therapeutic activity, 97112- Neuromuscular re-education, 97535- Self Care, 02859- Manual therapy, [phone]- Canalith repositioning, Patient/Family education, Balance training, and Vestibular training  PLAN FOR NEXT SESSION: Hold chart open x 30 days, for return if pt should have reoccurence of BPPV; otherwise, complete discharge after 30 days.   STARLET GREIG ORN., PT 11/24/2024, 3:12 PM   [facility] at [facility] [address] [place], [place] [address] Phone # [phone] Fax # [phone]  Referring diagnosis:  R42 (ICD-10-CM) - Vertigo  Treatment diagnosis (if different than referring diagnosis): R42, H81.11 Date Symptoms Began: September 2025 # of Visits requested: 5  Time period for Authorization: 11/24/2024 to 01/01/2025  What was this (referring dx) caused by? []  Surgery []  Fall [x]  Ongoing issue []  Arthritis []  Other: ____________  Laterality: [x]  Rt []  Lt []  Both  Functional Tool & Score: FGA score 19/30, DHI score 12 (improved from 58/100)  Check all possible CPT codes:     See Planned Interventions listed in the Plan section of the Evaluation.     If [employer]: Choose 10 or less codes  If [employer]: Modalities are not covered  If [employer]: Check allowed ICD code combinations   If [facility] Plan or [employer]igna: Cognitive training not covered   "

## 2024-12-01 ENCOUNTER — Ambulatory Visit

## 2025-01-28 ENCOUNTER — Ambulatory Visit: Admitting: Allergy
